# Patient Record
Sex: Female | Born: 1950 | Race: White | Hispanic: No | Marital: Married | Smoking: Never smoker
Health system: Southern US, Community
[De-identification: ages and names within clinical notes are randomized; demographics above are authoritative.]

## PROBLEM LIST (undated history)

## (undated) DIAGNOSIS — K219 Gastro-esophageal reflux disease without esophagitis: Secondary | ICD-10-CM

## (undated) DIAGNOSIS — U071 COVID-19: Secondary | ICD-10-CM

## (undated) DIAGNOSIS — T4145XA Adverse effect of unspecified anesthetic, initial encounter: Secondary | ICD-10-CM

## (undated) DIAGNOSIS — E079 Disorder of thyroid, unspecified: Secondary | ICD-10-CM

## (undated) DIAGNOSIS — F419 Anxiety disorder, unspecified: Secondary | ICD-10-CM

## (undated) DIAGNOSIS — K644 Residual hemorrhoidal skin tags: Secondary | ICD-10-CM

## (undated) DIAGNOSIS — Z9889 Other specified postprocedural states: Secondary | ICD-10-CM

## (undated) DIAGNOSIS — R112 Nausea with vomiting, unspecified: Secondary | ICD-10-CM

## (undated) DIAGNOSIS — T8859XA Other complications of anesthesia, initial encounter: Secondary | ICD-10-CM

## (undated) DIAGNOSIS — K648 Other hemorrhoids: Secondary | ICD-10-CM

## (undated) DIAGNOSIS — E785 Hyperlipidemia, unspecified: Secondary | ICD-10-CM

## (undated) DIAGNOSIS — E039 Hypothyroidism, unspecified: Secondary | ICD-10-CM

## (undated) HISTORY — DX: Gastro-esophageal reflux disease without esophagitis: K21.9

## (undated) HISTORY — DX: Hyperlipidemia, unspecified: E78.5

## (undated) HISTORY — DX: Residual hemorrhoidal skin tags: K64.4

## (undated) HISTORY — DX: Other hemorrhoids: K64.8

## (undated) HISTORY — DX: Disorder of thyroid, unspecified: E07.9

## (undated) HISTORY — DX: Anxiety disorder, unspecified: F41.9

## (undated) HISTORY — PX: ABDOMINAL HYSTERECTOMY: SHX81

## (undated) HISTORY — DX: COVID-19: U07.1

---

## 1984-05-12 HISTORY — PX: ROTATOR CUFF REPAIR: SHX139

## 1990-05-12 HISTORY — PX: TONSILLECTOMY AND ADENOIDECTOMY: SHX28

## 2005-05-08 ENCOUNTER — Ambulatory Visit: Payer: Self-pay | Admitting: Family Medicine

## 2007-07-09 ENCOUNTER — Ambulatory Visit: Payer: Self-pay

## 2007-12-03 ENCOUNTER — Ambulatory Visit: Payer: Self-pay | Admitting: Unknown Physician Specialty

## 2008-10-17 ENCOUNTER — Ambulatory Visit: Payer: Self-pay

## 2009-09-29 ENCOUNTER — Ambulatory Visit: Payer: Self-pay | Admitting: Internal Medicine

## 2009-10-18 ENCOUNTER — Ambulatory Visit: Payer: Self-pay

## 2010-10-24 ENCOUNTER — Ambulatory Visit: Payer: Self-pay | Admitting: Family Medicine

## 2011-12-10 ENCOUNTER — Ambulatory Visit: Payer: Self-pay | Admitting: Family Medicine

## 2012-12-30 ENCOUNTER — Ambulatory Visit: Payer: Self-pay | Admitting: Unknown Physician Specialty

## 2013-02-24 ENCOUNTER — Ambulatory Visit: Payer: Self-pay | Admitting: Family Medicine

## 2013-10-28 ENCOUNTER — Ambulatory Visit: Payer: Self-pay | Admitting: Family Medicine

## 2013-10-28 LAB — HM MAMMOGRAPHY

## 2013-12-05 LAB — HEPATIC FUNCTION PANEL
ALK PHOS: 57 U/L (ref 25–125)
ALT: 19 U/L (ref 7–35)
AST: 18 U/L (ref 13–35)
Bilirubin, Total: 0.7 mg/dL

## 2013-12-05 LAB — TSH: TSH: 4.69 u[IU]/mL (ref 0.41–5.90)

## 2013-12-05 LAB — HEMOGLOBIN A1C: Hgb A1c MFr Bld: 6 % (ref 4.0–6.0)

## 2013-12-05 LAB — LIPID PANEL
Cholesterol: 246 mg/dL — AB (ref 0–200)
HDL: 84 mg/dL — AB (ref 35–70)
LDL Cholesterol: 142 mg/dL
Triglycerides: 98 mg/dL (ref 40–160)

## 2013-12-05 LAB — BASIC METABOLIC PANEL
BUN: 16 mg/dL (ref 4–21)
CREATININE: 1 mg/dL (ref 0.5–1.1)
GLUCOSE: 96 mg/dL
POTASSIUM: 4.5 mmol/L (ref 3.4–5.3)
Sodium: 140 mmol/L (ref 137–147)

## 2013-12-05 LAB — CBC AND DIFFERENTIAL
HEMATOCRIT: 42 % (ref 36–46)
HEMOGLOBIN: 13.9 g/dL (ref 12.0–16.0)
Neutrophils Absolute: 2 /uL
PLATELETS: 245 10*3/uL (ref 150–399)
WBC: 4.7 10*3/mL

## 2014-09-27 ENCOUNTER — Ambulatory Visit: Payer: Self-pay | Admitting: Nurse Practitioner

## 2014-09-29 ENCOUNTER — Ambulatory Visit: Payer: Self-pay | Admitting: Nurse Practitioner

## 2014-09-29 ENCOUNTER — Telehealth: Payer: Self-pay | Admitting: Nurse Practitioner

## 2014-09-29 DIAGNOSIS — Z0289 Encounter for other administrative examinations: Secondary | ICD-10-CM

## 2014-09-29 NOTE — Telephone Encounter (Signed)
Pt came in for appt at 1:30pm. Pt was informed that her appt was at 1pm. Pt advise that she would have to resch. Pt scheduled for next week. Please advise to cancel for today.msn

## 2014-09-29 NOTE — Telephone Encounter (Signed)
Okay to cancel

## 2014-10-03 ENCOUNTER — Ambulatory Visit: Payer: Self-pay | Admitting: Nurse Practitioner

## 2014-10-13 ENCOUNTER — Encounter (INDEPENDENT_AMBULATORY_CARE_PROVIDER_SITE_OTHER): Payer: Self-pay

## 2014-10-13 ENCOUNTER — Encounter: Payer: Self-pay | Admitting: Nurse Practitioner

## 2014-10-13 ENCOUNTER — Ambulatory Visit (INDEPENDENT_AMBULATORY_CARE_PROVIDER_SITE_OTHER): Payer: No Typology Code available for payment source | Admitting: Nurse Practitioner

## 2014-10-13 VITALS — BP 122/64 | HR 83 | Temp 98.3°F | Resp 16 | Ht 67.0 in | Wt 194.8 lb

## 2014-10-13 DIAGNOSIS — Z7689 Persons encountering health services in other specified circumstances: Secondary | ICD-10-CM | POA: Insufficient documentation

## 2014-10-13 DIAGNOSIS — F411 Generalized anxiety disorder: Secondary | ICD-10-CM

## 2014-10-13 DIAGNOSIS — E039 Hypothyroidism, unspecified: Secondary | ICD-10-CM | POA: Diagnosis not present

## 2014-10-13 DIAGNOSIS — Z7189 Other specified counseling: Secondary | ICD-10-CM | POA: Diagnosis not present

## 2014-10-13 DIAGNOSIS — N951 Menopausal and female climacteric states: Secondary | ICD-10-CM | POA: Diagnosis not present

## 2014-10-13 NOTE — Assessment & Plan Note (Signed)
Okay to wean off of Citalopram 10 mg. Told her she can jump off at anytime. Denies suicidal/homicidal ideations. Will follow up at CPE.

## 2014-10-13 NOTE — Assessment & Plan Note (Signed)
Stable on 75 mcg of levothyroxine. Need to repeat full thyroid panel in August when she come for CPE. Will follow.

## 2014-10-13 NOTE — Assessment & Plan Note (Signed)
Taking a compound of estradiol. No specific information, pt feels better when taking this. She goes to a pharmacy in [place] for refills, but wants to talk to [facility] in [place]. Will contact us with specifics regarding refills.

## 2014-10-13 NOTE — Progress Notes (Signed)
Subjective:    Patient ID: Sarah Jacobson, female    DOB: January 30, 1951, 64 y.o.   MRN: [id number]  HPI  Sarah Jacobson is a 64 yo female establishing care and CC of weaning off Citalopram.   1) New pt info:   Immunizations- tdap- 5.5 years ago  Mammogram- July 2015   Pap- 2 years ago, hysterectomy new  Bone Density- Unknown date - osteopenia  Colonoscopy- 12/30/2012   Eye Exam- April 2016   Dental Exam- UTD  2) Chronic Problems-  Anxiety- situational, stressful work a few years ago, lost home a few years ago, Lexapro- was helpful 10 mg every other day, switched to Citalopram and taking every other day to wean off  ADD- Pt feels Wellbutrin is helpful 300 mg   Hypothyroidism- 75 mg levothyroxine daily   Estradiol- compound pharmacy, uses as needed a few times a week (2-3 x) She will contact us with information regarding prescribing when time for refill.   3) Acute Problems-  Wants to wean off of Citalopram, wants to know when she can stop taking it and she feels good doing it every other day now.     Dermatology- Dr. Nehemiah Massed- full body check last month and had a few places frozen  Review of Systems  Constitutional: Negative for fever, chills, diaphoresis and fatigue.  HENT: Negative for tinnitus and trouble swallowing.   Eyes: Negative for visual disturbance.  Respiratory: Negative for chest tightness, shortness of breath and wheezing.   Cardiovascular: Negative for chest pain, palpitations and leg swelling.  Gastrointestinal: Negative for nausea, vomiting and diarrhea.  Genitourinary: Negative for difficulty urinating.  Musculoskeletal: Negative for back pain and neck pain.  Skin: Negative for rash.  Neurological: Negative for dizziness, weakness, numbness and headaches.  Hematological: Does not bruise/bleed easily.  Psychiatric/Behavioral: Negative for suicidal ideas and sleep disturbance. The patient is nervous/anxious.    Past Medical History  Diagnosis Date  . GERD  (gastroesophageal reflux disease)   . Thyroid disease   . Anxiety     History   Social History  . Marital Status: Married    Spouse Name: N/A  . Number of Children: N/A  . Years of Education: N/A   Occupational History  . Not on file.   Social History Main Topics  . Smoking status: Never Smoker   . Smokeless tobacco: Never Used  . Alcohol Use: 0.6 oz/week    1 Glasses of wine per week  . Drug Use: No  . Sexual Activity:    Partners: Male     Comment: Husband   Other Topics Concern  . Not on file   Social History Narrative   Lives with husband (37 years of marriage)    Children-3    No Pets   Right handed    Caffeine- 2-3 cups coffee, tea occasionally    Enjoys being with the grandchildren     Past Surgical History  Procedure Laterality Date  . Tonsillectomy and adenoidectomy  1992  . Abdominal hysterectomy  1982/2000    uterus/ovaries  . Rotator cuff repair  1986    Both shoulders    Family History  Problem Relation Age of Onset  . Cancer Father     Lung Cancer- smoker    Allergies  Allergen Reactions  . Sulfa Antibiotics Hives  . Codeine Nausea And Vomiting  . Penicillins Hives    No current outpatient prescriptions on file prior to visit.   No current facility-administered medications on file  prior to visit.      Objective:   Physical Exam  Constitutional: She is oriented to person, place, and time. She appears well-developed and well-nourished. No distress.  BP 122/64 mmHg  Pulse 83  Temp(Src) 98.3 F (36.8 C)  Resp 16  Ht 5\' 7"  (1.702 m)  Wt 194 lb 12.8 oz (88.361 kg)  BMI 30.50 kg/m2  SpO2 95%   HENT:  Head: Normocephalic and atraumatic.  Right Ear: External ear normal.  Left Ear: External ear normal.  Eyes: EOM are normal. Pupils are equal, round, and reactive to light. Right eye exhibits no discharge. Left eye exhibits no discharge. No scleral icterus.  Cardiovascular: Normal rate, regular rhythm and normal heart sounds.  Exam  reveals no gallop and no friction rub.   No murmur heard. Pulmonary/Chest: Effort normal and breath sounds normal. No respiratory distress. She has no wheezes. She has no rales. She exhibits no tenderness.  Neurological: She is alert and oriented to person, place, and time. No cranial nerve deficit. She exhibits normal muscle tone. Coordination normal.  Skin: Skin is warm and dry. No rash noted. She is not diaphoretic.  Psychiatric: She has a normal mood and affect. Her behavior is normal. Judgment and thought content normal.      Assessment & Plan:

## 2014-10-13 NOTE — Assessment & Plan Note (Signed)
Discussed acute and chronic issues. Reviewed health maintenance measures, PFSHx, and immunizations. Obtain records from [facility] office. Labs- last done in July 2015

## 2014-10-13 NOTE — Patient Instructions (Addendum)
Once you do the every other day of Citalopram you can stop it.   We will see you for your physical in a month or two. (Fasting labs- nothing to eat or drink after midnight except water and black coffee).    Nice to meet you and Welcome to [facility]!

## 2014-10-23 ENCOUNTER — Encounter: Payer: Self-pay | Admitting: Nurse Practitioner

## 2014-11-17 ENCOUNTER — Other Ambulatory Visit: Payer: Self-pay | Admitting: *Deleted

## 2014-11-17 MED ORDER — BUPROPION HCL ER (XL) 300 MG PO TB24: 300.0000 mg | ORAL_TABLET | Freq: Every day | ORAL | Status: DC

## 2014-11-17 MED ORDER — CITALOPRAM HYDROBROMIDE 10 MG PO TABS: 10.0000 mg | ORAL_TABLET | Freq: Every day | ORAL | Status: DC

## 2014-11-17 MED ORDER — LEVOTHYROXINE SODIUM 75 MCG PO TABS: 75.0000 ug | ORAL_TABLET | Freq: Every day | ORAL | Status: DC

## 2014-12-04 ENCOUNTER — Encounter: Payer: Self-pay | Admitting: *Deleted

## 2014-12-12 ENCOUNTER — Other Ambulatory Visit: Payer: Self-pay

## 2014-12-12 MED ORDER — FLUTICASONE PROPIONATE 50 MCG/ACT NA SUSP: 1.0000 | NASAL | Status: DC | PRN

## 2014-12-13 ENCOUNTER — Other Ambulatory Visit: Payer: Self-pay

## 2014-12-13 MED ORDER — FLUTICASONE PROPIONATE 50 MCG/ACT NA SUSP: 1.0000 | NASAL | Status: DC | PRN

## 2014-12-20 ENCOUNTER — Telehealth: Payer: Self-pay | Admitting: *Deleted

## 2014-12-20 ENCOUNTER — Other Ambulatory Visit (INDEPENDENT_AMBULATORY_CARE_PROVIDER_SITE_OTHER): Payer: No Typology Code available for payment source

## 2014-12-20 DIAGNOSIS — Z0000 Encounter for general adult medical examination without abnormal findings: Secondary | ICD-10-CM

## 2014-12-20 NOTE — Telephone Encounter (Signed)
Labs and dx?  

## 2014-12-20 NOTE — Telephone Encounter (Signed)
OK. Let's order labs for a physical. CBC, CMP, lipids, TSH, A1c, VIt d.

## 2014-12-20 NOTE — Telephone Encounter (Signed)
Below

## 2014-12-20 NOTE — Telephone Encounter (Signed)
Sarah Jacobson, I don't see any notes about repeating labs? Was this for a physical exam?

## 2014-12-20 NOTE — Telephone Encounter (Signed)
She will see Sarah Jacobson on the 15th

## 2014-12-21 LAB — CBC WITH DIFFERENTIAL/PLATELET
BASOS ABS: 0 10*3/uL (ref 0.0–0.1)
Basophils Relative: 0.9 % (ref 0.0–3.0)
EOS ABS: 0.2 10*3/uL (ref 0.0–0.7)
EOS PCT: 4.6 % (ref 0.0–5.0)
HCT: 43.7 % (ref 36.0–46.0)
HEMOGLOBIN: 14.5 g/dL (ref 12.0–15.0)
Lymphocytes Relative: 33.8 % (ref 12.0–46.0)
Lymphs Abs: 1.7 10*3/uL (ref 0.7–4.0)
MCHC: 33.3 g/dL (ref 30.0–36.0)
MCV: 92.2 fl (ref 78.0–100.0)
MONOS PCT: 8.6 % (ref 3.0–12.0)
Monocytes Absolute: 0.4 10*3/uL (ref 0.1–1.0)
NEUTROS ABS: 2.6 10*3/uL (ref 1.4–7.7)
Neutrophils Relative %: 52.1 % (ref 43.0–77.0)
PLATELETS: 241 10*3/uL (ref 150.0–400.0)
RBC: 4.74 Mil/uL (ref 3.87–5.11)
RDW: 13.6 % (ref 11.5–15.5)
WBC: 5 10*3/uL (ref 4.0–10.5)

## 2014-12-21 LAB — VITAMIN D 25 HYDROXY (VIT D DEFICIENCY, FRACTURES): VITD: 47.45 ng/mL (ref 30.00–100.00)

## 2014-12-21 LAB — COMPREHENSIVE METABOLIC PANEL
ALT: 23 U/L (ref 0–35)
AST: 20 U/L (ref 0–37)
Albumin: 4.1 g/dL (ref 3.5–5.2)
Alkaline Phosphatase: 56 U/L (ref 39–117)
BUN: 15 mg/dL (ref 6–23)
CALCIUM: 9.7 mg/dL (ref 8.4–10.5)
CO2: 30 mEq/L (ref 19–32)
CREATININE: 1.05 mg/dL (ref 0.40–1.20)
Chloride: 104 mEq/L (ref 96–112)
GFR: 56.11 mL/min — AB (ref >60.00)
Glucose, Bld: 93 mg/dL (ref 70–99)
POTASSIUM: 4.6 meq/L (ref 3.5–5.1)
Sodium: 142 mEq/L (ref 135–145)
TOTAL PROTEIN: 6.6 g/dL (ref 6.0–8.3)
Total Bilirubin: 0.6 mg/dL (ref 0.2–1.2)

## 2014-12-21 LAB — HEMOGLOBIN A1C: Hgb A1c MFr Bld: 5.7 % (ref 4.6–6.5)

## 2014-12-21 LAB — LIPID PANEL
CHOL/HDL RATIO: 3
CHOLESTEROL: 248 mg/dL — AB (ref 0–200)
HDL: 80.1 mg/dL (ref >39.00)
LDL CALC: 150 mg/dL — AB (ref 0–99)
NonHDL: 168.29
Triglycerides: 92 mg/dL (ref 0.0–149.0)
VLDL: 18.4 mg/dL (ref 0.0–40.0)

## 2014-12-21 NOTE — Telephone Encounter (Signed)
Thanks for ordering labs.

## 2014-12-25 ENCOUNTER — Ambulatory Visit (INDEPENDENT_AMBULATORY_CARE_PROVIDER_SITE_OTHER): Payer: No Typology Code available for payment source | Admitting: Nurse Practitioner

## 2014-12-25 VITALS — BP 124/70 | HR 69 | Temp 98.3°F | Resp 16 | Ht 67.0 in | Wt 196.0 lb

## 2014-12-25 DIAGNOSIS — Z0000 Encounter for general adult medical examination without abnormal findings: Secondary | ICD-10-CM | POA: Diagnosis not present

## 2014-12-25 DIAGNOSIS — N951 Menopausal and female climacteric states: Secondary | ICD-10-CM

## 2014-12-25 DIAGNOSIS — F411 Generalized anxiety disorder: Secondary | ICD-10-CM

## 2014-12-25 DIAGNOSIS — E039 Hypothyroidism, unspecified: Secondary | ICD-10-CM

## 2014-12-25 DIAGNOSIS — E669 Obesity, unspecified: Secondary | ICD-10-CM

## 2014-12-25 MED ORDER — PHENTERMINE HCL 37.5 MG PO TABS: 37.5000 mg | ORAL_TABLET | Freq: Every day | ORAL | Status: DC

## 2014-12-25 NOTE — Progress Notes (Signed)
Pre visit review using our clinic review tool, if applicable. No additional management support is needed unless otherwise documented below in the visit note. 

## 2014-12-25 NOTE — Progress Notes (Signed)
Patient ID: Sarah Jacobson, female    DOB: Dec 18, 1950  Age: 64 y.o. MRN: [id number]  CC: Annual Exam   HPI Sarah Jacobson presents for her annual physical exam.   1) Health Maintenance-   Diet- Working on portion control currently   Exercise- 2-3 days a week walking for 30 min.   Immunizations- UTD  Mammogram- 11/09/2013   Pap- hysterectomy  Bone Density- Osteopenia- On Calcium + D  Colonoscopy- 12/30/2012   Eye Exam- 08/2014   Dental Exam- UTD  Dermatology- Sees Dr. Nehemiah Massed for full body check, UTD for 2016   Labs- 12/20/14 for routine  HIV/Hep C screen- Declines today  2) Chronic Problems-  Anxiety- Weaned off Citalopram   Estradiol- 1.88% Applicator 2-3 x week   History Sarah Jacobson has a past medical history of GERD (gastroesophageal reflux disease); Thyroid disease; and Anxiety.   She has past surgical history that includes Tonsillectomy and adenoidectomy (1992); Abdominal hysterectomy (1982/2000); and Rotator cuff repair (1986).   Her family history includes Cancer in her father.She reports that she has never smoked. She has never used smokeless tobacco. She reports that she drinks about 0.6 oz of alcohol per week. She reports that she does not use illicit drugs.  Outpatient Prescriptions Prior to Visit  Medication Sig Dispense Refill  . buPROPion (WELLBUTRIN XL) 300 MG 24 hr tablet Take 1 tablet (300 mg total) by mouth daily. 30 tablet 2  . Calcium Carb-Cholecalciferol (CALCIUM 600 + D PO) Take 1 tablet by mouth daily.    . Cholecalciferol (VITAMIN D) 2000 UNITS tablet Take 2,000 Units by mouth daily.    . Cyanocobalamin (VITAMIN B 12 PO) Take 1 tablet by mouth daily.    . fexofenadine (ALLEGRA) 180 MG tablet Take 90 mg by mouth as needed for allergies or rhinitis.    . fluticasone (FLONASE) 50 MCG/ACT nasal spray Place 1 spray into both nostrils as needed for allergies or rhinitis. 16 g 5  . levothyroxine (SYNTHROID, LEVOTHROID) 75 MCG tablet Take 1 tablet (75 mcg total) by mouth  daily before breakfast. 30 tablet 1  . Misc Natural Products (COSAMIN ASU ADVANCED FORMULA PO) Take 1 tablet by mouth daily.    . Multiple Vitamin (MULTI VITAMIN DAILY PO) Take 1 tablet by mouth daily.    . progesterone (PROMETRIUM) 100 MG capsule Take 100 mg by mouth daily.    . citalopram (CELEXA) 10 MG tablet Take 1 tablet (10 mg total) by mouth daily. (Patient not taking: Reported on 12/25/2014) 30 tablet 2   No facility-administered medications prior to visit.    ROS Review of Systems  Constitutional: Negative for fever, chills, diaphoresis, fatigue and unexpected weight change.  HENT: Negative for tinnitus and trouble swallowing.   Gastrointestinal: Negative for nausea, vomiting, abdominal pain, diarrhea, constipation and blood in stool.  Endocrine: Negative for polydipsia, polyphagia and polyuria.  Genitourinary: Negative for dysuria, hematuria, vaginal discharge and vaginal pain.  Musculoskeletal: Negative for myalgias, back pain, arthralgias and gait problem.  Skin: Negative for color change and rash.  Neurological: Negative for dizziness, weakness, numbness and headaches.  Hematological: Does not bruise/bleed easily.  Psychiatric/Behavioral: Negative for suicidal ideas and sleep disturbance. The patient is nervous/anxious.     Objective:  BP 124/70 mmHg  Pulse 69  Temp(Src) 98.3 F (36.8 C)  Resp 16  Ht 5\' 7"  (1.702 m)  Wt 196 lb (88.905 kg)  BMI 30.69 kg/m2  SpO2 97%  Physical Exam  Constitutional: She is oriented to person, place,  and time. She appears well-developed and well-nourished. No distress.  HENT:  Head: Normocephalic and atraumatic.  Right Ear: External ear normal.  Left Ear: External ear normal.  Nose: Nose normal.  Mouth/Throat: Oropharynx is clear and moist. No oropharyngeal exudate.  TMs and canals clear bilaterally  Eyes: Conjunctivae and EOM are normal. Pupils are equal, round, and reactive to light. Right eye exhibits no discharge. Left eye  exhibits no discharge. No scleral icterus.  Wearing glasses  Neck: Normal range of motion. Neck supple. No thyromegaly present.  Cardiovascular: Normal rate, regular rhythm, normal heart sounds and intact distal pulses.  Exam reveals no gallop and no friction rub.   No murmur heard. Pulmonary/Chest: Effort normal and breath sounds normal. No respiratory distress. She has no wheezes. She has no rales. She exhibits no tenderness.  Clinical breast exam is without significant findings  Abdominal: Soft. Bowel sounds are normal. She exhibits no distension and no mass. There is no tenderness. There is no rebound and no guarding.  Musculoskeletal: Normal range of motion. She exhibits no edema or tenderness.  Lymphadenopathy:    She has no cervical adenopathy.  Neurological: She is alert and oriented to person, place, and time. She has normal reflexes. No cranial nerve deficit. She exhibits normal muscle tone. Coordination normal.  Skin: Skin is warm and dry. No rash noted. She is not diaphoretic. No erythema. No pallor.  Psychiatric: She has a normal mood and affect. Her behavior is normal. Judgment and thought content normal.   Assessment & Plan:   Sarah Jacobson was seen today for annual exam.  Diagnoses and all orders for this visit:  Routine general medical examination at a health care facility  Generalized anxiety disorder  Vaginal dryness, menopausal  Hypothyroidism, unspecified hypothyroidism type  Obese  Other orders -     phentermine (ADIPEX-P) 37.5 MG tablet; Take 1 tablet (37.5 mg total) by mouth daily before breakfast.   I have discontinued Ms. Karp's citalopram. I am also having her start on phentermine. Additionally, I am having her maintain her progesterone, fexofenadine, Multiple Vitamin (MULTI VITAMIN DAILY PO), Vitamin D, Calcium Carb-Cholecalciferol (CALCIUM 600 + D PO), Cyanocobalamin (VITAMIN B 12 PO), Misc Natural Products (COSAMIN ASU ADVANCED FORMULA PO), levothyroxine,  buPROPion, and fluticasone.  Meds ordered this encounter  Medications  . phentermine (ADIPEX-P) 37.5 MG tablet    Sig: Take 1 tablet (37.5 mg total) by mouth daily before breakfast.    Dispense:  30 tablet    Refill:  0    Order Specific Question:  Supervising Provider    Answer:  Crecencio Mc [[id number]]     Follow-up: Return in about 4 weeks (around 01/22/2015) for Weight loss.

## 2014-12-25 NOTE — Patient Instructions (Signed)

## 2014-12-31 ENCOUNTER — Encounter: Payer: Self-pay | Admitting: Nurse Practitioner

## 2014-12-31 DIAGNOSIS — E669 Obesity, unspecified: Secondary | ICD-10-CM | POA: Insufficient documentation

## 2014-12-31 DIAGNOSIS — Z0000 Encounter for general adult medical examination without abnormal findings: Secondary | ICD-10-CM | POA: Insufficient documentation

## 2014-12-31 NOTE — Assessment & Plan Note (Signed)
Discussed acute and chronic issues. Reviewed health maintenance measures, PFSHx, and immunizations. Obtained labs on 8/10 and reviewed with pt again at today's visit.   Clinical breast exam normal today   Pt would like to do mammograms every 2 years  Declined HIV and Hep C screening today

## 2014-12-31 NOTE — Assessment & Plan Note (Signed)
BMI 30. Pt up 2 lbs. Asked pt to work on diet and continue her current exercise routine. Added Adipex-P to help with weight loss. FU in 1 month  Wt Readings from Last 3 Encounters:  12/25/14 196 lb (88.905 kg)  10/13/14 194 lb 12.8 oz (88.361 kg)

## 2014-12-31 NOTE — Assessment & Plan Note (Signed)
Stable on Estradiol 3.33% applicators are 1 mL and uses 2-3 x a week. Has compounded.

## 2014-12-31 NOTE — Assessment & Plan Note (Signed)
Stable after weaning off of citalopram.

## 2014-12-31 NOTE — Assessment & Plan Note (Signed)
Stable. No need for refills pt reports.

## 2015-01-12 ENCOUNTER — Other Ambulatory Visit: Payer: Self-pay | Admitting: Nurse Practitioner

## 2015-01-22 ENCOUNTER — Ambulatory Visit (INDEPENDENT_AMBULATORY_CARE_PROVIDER_SITE_OTHER): Payer: No Typology Code available for payment source | Admitting: Nurse Practitioner

## 2015-01-22 VITALS — BP 116/70 | HR 77 | Temp 98.1°F | Resp 14 | Ht 67.0 in | Wt 188.8 lb

## 2015-01-22 DIAGNOSIS — N951 Menopausal and female climacteric states: Secondary | ICD-10-CM | POA: Diagnosis not present

## 2015-01-22 DIAGNOSIS — L821 Other seborrheic keratosis: Secondary | ICD-10-CM

## 2015-01-22 DIAGNOSIS — E669 Obesity, unspecified: Secondary | ICD-10-CM

## 2015-01-22 MED ORDER — PHENTERMINE HCL 37.5 MG PO TABS: 37.5000 mg | ORAL_TABLET | Freq: Every day | ORAL | Status: DC

## 2015-01-22 NOTE — Progress Notes (Signed)
Patient ID: Sarah Jacobson, female    DOB: 08-09-1950  Age: 64 y.o. MRN: [id number]  CC: Follow-up   HPI Sarah Jacobson presents for follow up of weight loss.   1) Breast mole on left- increasingly raised spot on breast.   2) Weight loss of 8 lbs, dry mouth, breaking in half, eating low carb diet   Wt Readings from Last 3 Encounters:  01/22/15 188 lb 12.8 oz (85.639 kg)  12/25/14 196 lb (88.905 kg)  10/13/14 194 lb 12.8 oz (88.361 kg)   3) Vaginal dryness- burning internally, denies itching, discharge, or odor  History Sarah Jacobson has a past medical history of GERD (gastroesophageal reflux disease); Thyroid disease; and Anxiety.   She has past surgical history that includes Tonsillectomy and adenoidectomy (1992); Abdominal hysterectomy (1982/2000); and Rotator cuff repair (1986).   Her family history includes Cancer in her father.She reports that she has never smoked. She has never used smokeless tobacco. She reports that she drinks about 0.6 oz of alcohol per week. She reports that she does not use illicit drugs.  Outpatient Prescriptions Prior to Visit  Medication Sig Dispense Refill  . buPROPion (WELLBUTRIN XL) 300 MG 24 hr tablet Take 1 tablet (300 mg total) by mouth daily. 30 tablet 2  . Calcium Carb-Cholecalciferol (CALCIUM 600 + D PO) Take 1 tablet by mouth daily.    . Cholecalciferol (VITAMIN D) 2000 UNITS tablet Take 2,000 Units by mouth daily.    . Cyanocobalamin (VITAMIN B 12 PO) Take 1 tablet by mouth daily.    . fexofenadine (ALLEGRA) 180 MG tablet Take 90 mg by mouth as needed for allergies or rhinitis.    . fluticasone (FLONASE) 50 MCG/ACT nasal spray Place 1 spray into both nostrils as needed for allergies or rhinitis. 16 g 5  . levothyroxine (SYNTHROID, LEVOTHROID) 75 MCG tablet TAKE ONE (1) TABLET EACH DAY FOR BREAKFAST 30 tablet 0  . Misc Natural Products (COSAMIN ASU ADVANCED FORMULA PO) Take 1 tablet by mouth daily.    . Multiple Vitamin (MULTI VITAMIN DAILY PO) Take 1  tablet by mouth daily.    . progesterone (PROMETRIUM) 100 MG capsule Take 100 mg by mouth daily.    . phentermine (ADIPEX-P) 37.5 MG tablet Take 1 tablet (37.5 mg total) by mouth daily before breakfast. 30 tablet 0   No facility-administered medications prior to visit.    ROS Review of Systems  Constitutional: Negative for fever, chills, diaphoresis and fatigue.  Respiratory: Negative for chest tightness, shortness of breath and wheezing.   Cardiovascular: Negative for chest pain, palpitations and leg swelling.  Gastrointestinal: Negative for nausea, vomiting and diarrhea.  Skin: Positive for color change.       Mole on left breast  Neurological: Negative for dizziness, weakness, numbness and headaches.  Psychiatric/Behavioral: The patient is not nervous/anxious.     Objective:  BP 116/70 mmHg  Pulse 77  Temp(Src) 98.1 F (36.7 C)  Resp 14  Ht 5\' 7"  (1.702 m)  Wt 188 lb 12.8 oz (85.639 kg)  BMI 29.56 kg/m2  SpO2 95%  Physical Exam  Constitutional: She is oriented to person, place, and time. She appears well-developed and well-nourished. No distress.  HENT:  Head: Normocephalic and atraumatic.  Right Ear: External ear normal.  Left Ear: External ear normal.  Cardiovascular: Normal rate, regular rhythm and normal heart sounds.  Exam reveals no gallop and no friction rub.   No murmur heard. Pulmonary/Chest: Effort normal and breath sounds normal. No respiratory distress.  She has no wheezes. She has no rales. She exhibits no tenderness.  Neurological: She is alert and oriented to person, place, and time. No cranial nerve deficit. She exhibits normal muscle tone. Coordination normal.  Skin: Skin is warm and dry. No rash noted. She is not diaphoretic.  0.5 cm Seborrheic Keratosis on left breast  Psychiatric: She has a normal mood and affect. Her behavior is normal. Judgment and thought content normal.   Assessment & Plan:   Sarah Jacobson was seen today for follow-up.  Diagnoses and  all orders for this visit:  Seborrheic keratosis  Vaginal dryness, menopausal  Obese  Other orders -     phentermine (ADIPEX-P) 37.5 MG tablet; Take 1 tablet (37.5 mg total) by mouth daily before breakfast.   I am having [name] maintain her progesterone, fexofenadine, Multiple Vitamin (MULTI VITAMIN DAILY PO), Vitamin D, Calcium Carb-Cholecalciferol (CALCIUM 600 + D PO), Cyanocobalamin (VITAMIN B 12 PO), Misc Natural Products (COSAMIN ASU ADVANCED FORMULA PO), buPROPion, fluticasone, levothyroxine, and phentermine.  Meds ordered this encounter  Medications  . phentermine (ADIPEX-P) 37.5 MG tablet    Sig: Take 1 tablet (37.5 mg total) by mouth daily before breakfast.    Dispense:  30 tablet    Refill:  1    Order Specific Question:  Supervising Provider    Answer:  Crecencio Mc [[id number]]     Follow-up: Return in about 2 months (around 03/24/2015) for Wt loss.

## 2015-01-22 NOTE — Progress Notes (Signed)
Pre visit review using our clinic review tool, if applicable. No additional management support is needed unless otherwise documented below in the visit note. 

## 2015-01-31 DIAGNOSIS — L821 Other seborrheic keratosis: Secondary | ICD-10-CM | POA: Insufficient documentation

## 2015-01-31 NOTE — Assessment & Plan Note (Signed)
Probable vaginal dryness. Pt declined exam today. Informed pt

## 2015-01-31 NOTE — Assessment & Plan Note (Signed)
Used Histofreezer to freeze site for approximately 40 sec., which was recommended on the instructions. Pt felt fine during and afterwards by verbal confirmation. Instructed her that there will be peeling in a few days. A band aid was placed on the site.

## 2015-02-01 ENCOUNTER — Encounter: Payer: Self-pay | Admitting: Nurse Practitioner

## 2015-02-01 NOTE — Assessment & Plan Note (Signed)
Wt Readings from Last 3 Encounters:  01/22/15 188 lb 12.8 oz (85.639 kg)  12/25/14 196 lb (88.905 kg)  10/13/14 194 lb 12.8 oz (88.361 kg)   Pt is down 8 lbs. Will give 2 months of phentermine. FU in 3 months

## 2015-02-09 ENCOUNTER — Other Ambulatory Visit: Payer: Self-pay | Admitting: Nurse Practitioner

## 2015-03-26 ENCOUNTER — Encounter: Payer: Self-pay | Admitting: Nurse Practitioner

## 2015-03-26 ENCOUNTER — Ambulatory Visit (INDEPENDENT_AMBULATORY_CARE_PROVIDER_SITE_OTHER): Payer: No Typology Code available for payment source | Admitting: Nurse Practitioner

## 2015-03-26 VITALS — BP 112/72 | HR 83 | Temp 98.4°F | Resp 14 | Ht 67.0 in | Wt 185.6 lb

## 2015-03-26 DIAGNOSIS — E669 Obesity, unspecified: Secondary | ICD-10-CM

## 2015-03-26 NOTE — Patient Instructions (Addendum)
Work on Exercise!   See you in Jan. Let me know if you need a refill.

## 2015-03-26 NOTE — Assessment & Plan Note (Signed)
Patient is setting goals today to get back on track. She is down 11 lbs from August and was commended for this. She will let me know when she needs another prescription because she is taking 1/2 a tablet daily currently. Encouraged weight loss and exercising 3 x a week for 30 min.

## 2015-03-26 NOTE — Progress Notes (Signed)
Patient ID: Sarah Jacobson, female    DOB: Mar 21, 1951  Age: 64 y.o. MRN: [id number]  CC: Follow-up   HPI TONASIA DELAIR presents for weight loss follow up.   1) 2 months on phentermine. 1/2 tablet works well enough on its own. Not taking the other half. Having dryness of the mouth and a different taste, which deters her from food. Down 11 lbs from August and down 3 lbs from September.   Diet- Went on 2 trips to [place] and [place], she reports she is getting back on board with healthy diet. Exercise- No formal currently and wants to start walking again 30 min a few times a week is her goal.  History Rayauna has a past medical history of GERD (gastroesophageal reflux disease); Thyroid disease; and Anxiety.   She has past surgical history that includes Tonsillectomy and adenoidectomy (1992); Abdominal hysterectomy (1982/2000); and Rotator cuff repair (1986).   Her family history includes Cancer in her father.She reports that she has never smoked. She has never used smokeless tobacco. She reports that she drinks about 0.6 oz of alcohol per week. She reports that she does not use illicit drugs.  Outpatient Prescriptions Prior to Visit  Medication Sig Dispense Refill  . buPROPion (WELLBUTRIN XL) 300 MG 24 hr tablet TAKE ONE (1) TABLET EACH DAY 30 tablet 5  . Calcium Carb-Cholecalciferol (CALCIUM 600 + D PO) Take 1 tablet by mouth daily.    . Cholecalciferol (VITAMIN D) 2000 UNITS tablet Take 2,000 Units by mouth daily.    . Cyanocobalamin (VITAMIN B 12 PO) Take 1 tablet by mouth daily.    . fexofenadine (ALLEGRA) 180 MG tablet Take 90 mg by mouth as needed for allergies or rhinitis.    . fluticasone (FLONASE) 50 MCG/ACT nasal spray Place 1 spray into both nostrils as needed for allergies or rhinitis. 16 g 5  . levothyroxine (SYNTHROID, LEVOTHROID) 75 MCG tablet TAKE ONE TABLET EVERY MORNING BEFORE BREAKFAST 30 tablet 4  . Misc Natural Products (COSAMIN ASU ADVANCED FORMULA PO) Take 1 tablet by mouth  daily.    . Multiple Vitamin (MULTI VITAMIN DAILY PO) Take 1 tablet by mouth daily.    . phentermine (ADIPEX-P) 37.5 MG tablet Take 1 tablet (37.5 mg total) by mouth daily before breakfast. 30 tablet 1  . progesterone (PROMETRIUM) 100 MG capsule Take 100 mg by mouth daily.     No facility-administered medications prior to visit.    ROS Review of Systems  Constitutional: Positive for appetite change. Negative for fever, chills, diaphoresis, activity change, fatigue and unexpected weight change.       Decreased appetite with medication  HENT: Negative for trouble swallowing and voice change.   Respiratory: Negative for chest tightness, shortness of breath and wheezing.   Cardiovascular: Negative for chest pain, palpitations and leg swelling.  Gastrointestinal: Negative for nausea, vomiting and diarrhea.  Psychiatric/Behavioral: The patient is not nervous/anxious.     Objective:  BP 112/72 mmHg  Pulse 83  Temp(Src) 98.4 F (36.9 C)  Resp 14  Ht 5\' 7"  (1.702 m)  Wt 185 lb 9.6 oz (84.188 kg)  BMI 29.06 kg/m2  SpO2 94%  Physical Exam  Constitutional: She is oriented to person, place, and time. She appears well-developed and well-nourished. No distress.  HENT:  Head: Normocephalic and atraumatic.  Right Ear: External ear normal.  Left Ear: External ear normal.  Neurological: She is alert and oriented to person, place, and time. No cranial nerve deficit. She exhibits normal  muscle tone. Coordination normal.  Skin: Skin is warm and dry. No rash noted. She is not diaphoretic.  Psychiatric: She has a normal mood and affect. Her behavior is normal. Judgment and thought content normal.   Assessment & Plan:   Trane was seen today for follow-up.  Diagnoses and all orders for this visit:  Obese  I am having Ms. Badolato maintain her progesterone, fexofenadine, Multiple Vitamin (MULTI VITAMIN DAILY PO), Vitamin D, Calcium Carb-Cholecalciferol (CALCIUM 600 + D PO), Cyanocobalamin (VITAMIN  B 12 PO), Misc Natural Products (COSAMIN ASU ADVANCED FORMULA PO), fluticasone, phentermine, levothyroxine, and buPROPion.  No orders of the defined types were placed in this encounter.     Follow-up: Return in about 8 weeks (around 05/21/2015) for Weight loss follow up.

## 2015-05-21 ENCOUNTER — Encounter: Payer: Self-pay | Admitting: Nurse Practitioner

## 2015-05-28 ENCOUNTER — Ambulatory Visit: Payer: No Typology Code available for payment source | Admitting: Nurse Practitioner

## 2015-09-24 ENCOUNTER — Other Ambulatory Visit: Payer: Self-pay | Admitting: Nurse Practitioner

## 2015-09-24 NOTE — Telephone Encounter (Signed)
[facility] patient.

## 2015-09-25 ENCOUNTER — Telehealth: Payer: Self-pay | Admitting: Nurse Practitioner

## 2015-09-25 MED ORDER — LEVOTHYROXINE SODIUM 75 MCG PO TABS: ORAL_TABLET | ORAL | Status: DC

## 2015-09-25 MED ORDER — BUPROPION HCL ER (XL) 300 MG PO TB24: ORAL_TABLET | ORAL | Status: DC

## 2015-09-25 NOTE — Telephone Encounter (Signed)
Pt called in about needing a refill for her medications. Pt went to pharmacy to try and refill them and was told that she needs to make a OV appt. Pt has made appt on 05/24 but needs her daily medications of buPROPion (WELLBUTRIN XL) 300 MG 24 hr tablet, levothyroxine (SYNTHROID, LEVOTHROID) 75 MCG tablet. Call pt @ [phone]. Thank you!

## 2015-09-25 NOTE — Telephone Encounter (Signed)
Refilled and spoke with the patient.

## 2015-10-04 ENCOUNTER — Ambulatory Visit (INDEPENDENT_AMBULATORY_CARE_PROVIDER_SITE_OTHER): Payer: Managed Care, Other (non HMO) | Admitting: Family Medicine

## 2015-10-04 ENCOUNTER — Telehealth: Payer: Self-pay | Admitting: Family Medicine

## 2015-10-04 ENCOUNTER — Encounter: Payer: Self-pay | Admitting: Family Medicine

## 2015-10-04 VITALS — BP 116/62 | HR 84 | Temp 98.3°F | Ht 67.0 in | Wt 187.2 lb

## 2015-10-04 DIAGNOSIS — J309 Allergic rhinitis, unspecified: Secondary | ICD-10-CM | POA: Insufficient documentation

## 2015-10-04 DIAGNOSIS — E785 Hyperlipidemia, unspecified: Secondary | ICD-10-CM | POA: Insufficient documentation

## 2015-10-04 DIAGNOSIS — E039 Hypothyroidism, unspecified: Secondary | ICD-10-CM | POA: Diagnosis not present

## 2015-10-04 DIAGNOSIS — F411 Generalized anxiety disorder: Secondary | ICD-10-CM

## 2015-10-04 DIAGNOSIS — N951 Menopausal and female climacteric states: Secondary | ICD-10-CM

## 2015-10-04 MED ORDER — ESTRADIOL 0.1 MG/GM VA CREA: 1.0000 | TOPICAL_CREAM | VAGINAL | Status: DC

## 2015-10-04 MED ORDER — FLUTICASONE PROPIONATE 50 MCG/ACT NA SUSP: 1.0000 | NASAL | Status: DC | PRN

## 2015-10-04 MED ORDER — BUPROPION HCL ER (XL) 300 MG PO TB24: ORAL_TABLET | ORAL | Status: DC

## 2015-10-04 MED ORDER — LEVOTHYROXINE SODIUM 75 MCG PO TABS: ORAL_TABLET | ORAL | Status: DC

## 2015-10-04 MED ORDER — FEXOFENADINE HCL 180 MG PO TABS: 90.0000 mg | ORAL_TABLET | ORAL | Status: DC | PRN

## 2015-10-04 NOTE — Assessment & Plan Note (Signed)
Stable on estrogen cream. Discussed risks. Advised discontinuation of progesterone.

## 2015-10-04 NOTE — Assessment & Plan Note (Signed)
Stable.  Continue wellbutrin. Refilled today.

## 2015-10-04 NOTE — Assessment & Plan Note (Signed)
Stable. Continuing synthroid. Refilled.

## 2015-10-04 NOTE — Assessment & Plan Note (Signed)
Well controlled. Continue Allegra & Flonase. Refilled today.

## 2015-10-04 NOTE — Assessment & Plan Note (Signed)
Stable. ASCVD risk score 3.4%. Will continue lifestyle changes. No meds at this point.

## 2015-10-04 NOTE — Progress Notes (Signed)
Subjective:  Patient ID: Sarah Jacobson, female    DOB: 1950-12-03  Age: 65 y.o. MRN: [id number]  CC: Follow up; Medication refill  HPI:  65 year old female presents for follow up.  Hypothyroidism  Stable on Synthroid.  No complaints of constipation, fatigue, etc.  Needs refill today.  Hyperlipidemia  Recent LDL was 150.   Has not been on medication.  Will discuss this today.  Allergic rhinitis  Stable on Allegra and flonase.  Requesting refill today.  GAD  Stable on Wellbutrin.  Needs refill today.  Vaginal dryness  Stable on Estradiol cream and Progesterone.  Would like to discuss whether she needs this today.  Social Hx   Social History   Social History  . Marital Status: Married    Spouse Name: N/A  . Number of Children: N/A  . Years of Education: N/A   Social History Main Topics  . Smoking status: Never Smoker   . Smokeless tobacco: Never Used  . Alcohol Use: 0.6 oz/week    1 Glasses of wine per week  . Drug Use: No  . Sexual Activity:    Partners: Male     Comment: Husband   Other Topics Concern  . None   Social History Narrative   Lives with husband (76 years of marriage)    Children-3    No Pets   Right handed    Caffeine- 2-3 cups coffee, tea occasionally    Enjoys being with the grandchildren     Review of Systems  Constitutional: Negative.   Genitourinary:       Vaginal dryness.   Objective:  BP 116/62 mmHg  Pulse 84  Temp(Src) 98.3 F (36.8 C) (Oral)  Ht 5\' 7"  (1.702 m)  Wt 187 lb 4 oz (84.936 kg)  BMI 29.32 kg/m2  SpO2 96%  BP/Weight 10/04/2015 03/26/2015 [id number]  Systolic BP [id number]  Diastolic BP 62 72 70  Wt. (Lbs) 187.25 185.6 188.8  BMI 29.32 29.06 29.56   Physical Exam  Constitutional: She is oriented to person, place, and time. She appears well-developed. No distress.  Cardiovascular: Normal rate and regular rhythm.   No murmur heard. Pulmonary/Chest: Effort normal. She has no wheezes. She has  no rales.  Abdominal: Soft. She exhibits no distension. There is no tenderness. There is no rebound.  Neurological: She is alert and oriented to person, place, and time.  Psychiatric: She has a normal mood and affect.  Vitals reviewed.  Lab Results  Component Value Date   WBC 5.0 12/20/2014   HGB 14.5 12/20/2014   HCT 43.7 12/20/2014   PLT 241.0 12/20/2014   GLUCOSE 93 12/20/2014   CHOL 248* 12/20/2014   TRIG 92.0 12/20/2014   HDL 80.10 12/20/2014   LDLCALC 150* 12/20/2014   ALT 23 12/20/2014   AST 20 12/20/2014   NA 142 12/20/2014   K 4.6 12/20/2014   CL 104 12/20/2014   CREATININE 1.05 12/20/2014   BUN 15 12/20/2014   CO2 30 12/20/2014   TSH 4.69 12/05/2013   HGBA1C 5.7 12/20/2014    Assessment & Plan:   Problem List Items Addressed This Visit    Vaginal dryness, menopausal    Stable on estrogen cream. Discussed risks. Advised discontinuation of progesterone.       Hypothyroidism - Primary    Stable. Continuing synthroid. Refilled.      Relevant Medications   levothyroxine (SYNTHROID, LEVOTHROID) 75 MCG tablet   Hyperlipidemia    Stable. ASCVD risk  score 3.4%. Will continue lifestyle changes. No meds at this point.      Generalized anxiety disorder    Stable.  Continue wellbutrin. Refilled today.      Allergic rhinitis    Well controlled. Continue Allegra & Flonase. Refilled today.         Meds ordered this encounter  Medications  . DISCONTD: estradiol (ESTRACE) 0.1 MG/GM vaginal cream    Sig: Place 1 Applicatorful vaginally 2 (two) times a week.   . estradiol (ESTRACE) 0.1 MG/GM vaginal cream    Sig: Place 1 Applicatorful vaginally 3 (three) times a week.    Dispense:  42.5 g    Refill:  3  . buPROPion (WELLBUTRIN XL) 300 MG 24 hr tablet    Sig: TAKE ONE (1) TABLET EACH DAY    Dispense:  90 tablet    Refill:  3  . fexofenadine (ALLEGRA) 180 MG tablet    Sig: Take 0.5 tablets (90 mg total) by mouth as needed for allergies or rhinitis.     Dispense:  90 tablet    Refill:  3  . fluticasone (FLONASE) 50 MCG/ACT nasal spray    Sig: Place 1 spray into both nostrils as needed for allergies or rhinitis.    Dispense:  16 g    Refill:  5  . levothyroxine (SYNTHROID, LEVOTHROID) 75 MCG tablet    Sig: TAKE ONE TABLET EVERY MORNING BEFORE BREAKFAST    Dispense:  90 tablet    Refill:  3   Follow-up: Later this year.  [place]

## 2015-10-04 NOTE — Progress Notes (Signed)
Pre visit review using our clinic review tool, if applicable. No additional management support is needed unless otherwise documented below in the visit note. 

## 2015-10-04 NOTE — Patient Instructions (Signed)
Follow up later this year.  I have sent in your medications.  Take care   Dr. Lacinda Axon  ** Ask for Ashleigh ** Ext [phone].

## 2015-11-19 ENCOUNTER — Other Ambulatory Visit: Payer: Self-pay | Admitting: Family Medicine

## 2015-11-19 ENCOUNTER — Encounter: Payer: Self-pay | Admitting: Family Medicine

## 2015-11-19 MED ORDER — ESTRADIOL 0.1 MG/GM VA CREA: 1.0000 | TOPICAL_CREAM | VAGINAL | Status: DC

## 2015-11-19 NOTE — Telephone Encounter (Signed)
Patient wants this medication changes to a suppository because it would be easier for her to use. Please advise the change in medication order?

## 2015-11-20 NOTE — Telephone Encounter (Signed)
Please have pharmacy switch. FYI the cream was sent to me to represcribe.

## 2015-11-20 NOTE — Telephone Encounter (Signed)
Via note patient wanted a suppository not cream

## 2015-11-21 ENCOUNTER — Telehealth: Payer: Self-pay | Admitting: Family Medicine

## 2015-11-21 NOTE — Telephone Encounter (Signed)
Patient was called to inform her of the medication change from vaginal cream to a vaginal tablet. Patient wanted to inform provider that she is currently treating herself for a yeast infection with monostat.

## 2016-01-25 ENCOUNTER — Ambulatory Visit: Payer: Managed Care, Other (non HMO) | Admitting: Family Medicine

## 2016-02-01 ENCOUNTER — Ambulatory Visit (INDEPENDENT_AMBULATORY_CARE_PROVIDER_SITE_OTHER): Payer: Managed Care, Other (non HMO) | Admitting: Family Medicine

## 2016-02-01 ENCOUNTER — Encounter: Payer: Self-pay | Admitting: Family Medicine

## 2016-02-01 VITALS — BP 130/72 | HR 85 | Temp 98.2°F | Ht 67.0 in | Wt 188.2 lb

## 2016-02-01 DIAGNOSIS — E785 Hyperlipidemia, unspecified: Secondary | ICD-10-CM

## 2016-02-01 DIAGNOSIS — R7303 Prediabetes: Secondary | ICD-10-CM

## 2016-02-01 DIAGNOSIS — E669 Obesity, unspecified: Secondary | ICD-10-CM | POA: Diagnosis not present

## 2016-02-01 DIAGNOSIS — Z23 Encounter for immunization: Secondary | ICD-10-CM | POA: Diagnosis not present

## 2016-02-01 DIAGNOSIS — Z130 Encounter for screening for diseases of the blood and blood-forming organs and certain disorders involving the immune mechanism: Secondary | ICD-10-CM | POA: Diagnosis not present

## 2016-02-01 DIAGNOSIS — Z01419 Encounter for gynecological examination (general) (routine) without abnormal findings: Secondary | ICD-10-CM | POA: Insufficient documentation

## 2016-02-01 DIAGNOSIS — Z1239 Encounter for other screening for malignant neoplasm of breast: Secondary | ICD-10-CM

## 2016-02-01 DIAGNOSIS — E039 Hypothyroidism, unspecified: Secondary | ICD-10-CM

## 2016-02-01 LAB — COMPREHENSIVE METABOLIC PANEL
ALT: 20 U/L (ref 0–35)
AST: 19 U/L (ref 0–37)
Albumin: 4 g/dL (ref 3.5–5.2)
Alkaline Phosphatase: 47 U/L (ref 39–117)
BUN: 19 mg/dL (ref 6–23)
CHLORIDE: 103 meq/L (ref 96–112)
CO2: 29 meq/L (ref 19–32)
CREATININE: 0.99 mg/dL (ref 0.40–1.20)
Calcium: 9.6 mg/dL (ref 8.4–10.5)
GFR: 59.84 mL/min — ABNORMAL LOW (ref >60.00)
Glucose, Bld: 93 mg/dL (ref 70–99)
POTASSIUM: 4.1 meq/L (ref 3.5–5.1)
SODIUM: 140 meq/L (ref 135–145)
Total Bilirubin: 0.7 mg/dL (ref 0.2–1.2)
Total Protein: 7.1 g/dL (ref 6.0–8.3)

## 2016-02-01 LAB — CBC
HEMATOCRIT: 43 % (ref 36.0–46.0)
Hemoglobin: 14.8 g/dL (ref 12.0–15.0)
MCHC: 34.4 g/dL (ref 30.0–36.0)
MCV: 89.4 fl (ref 78.0–100.0)
Platelets: 248 10*3/uL (ref 150.0–400.0)
RBC: 4.81 Mil/uL (ref 3.87–5.11)
RDW: 13.4 % (ref 11.5–15.5)
WBC: 5.1 10*3/uL (ref 4.0–10.5)

## 2016-02-01 LAB — LIPID PANEL
Cholesterol: 231 mg/dL — ABNORMAL HIGH (ref 0–200)
HDL: 76.3 mg/dL (ref >39.00)
LDL CALC: 135 mg/dL — AB (ref 0–99)
NONHDL: 154.51
Total CHOL/HDL Ratio: 3
Triglycerides: 98 mg/dL (ref 0.0–149.0)
VLDL: 19.6 mg/dL (ref 0.0–40.0)

## 2016-02-01 LAB — TSH: TSH: 1.98 u[IU]/mL (ref 0.35–4.50)

## 2016-02-01 LAB — HEMOGLOBIN A1C: Hgb A1c MFr Bld: 5.7 % (ref 4.6–6.5)

## 2016-02-01 MED ORDER — CITALOPRAM HYDROBROMIDE 10 MG PO TABS
10.0000 mg | ORAL_TABLET | Freq: Every day | ORAL | 3 refills | Status: DC
Start: 2016-02-01 — End: 2017-03-24

## 2016-02-01 NOTE — Assessment & Plan Note (Signed)
Pap smear no longer needed. Pelvic exam performed today - normal. Normal breast exam. Arranging mammogram. Labs today. Flu shot given. Colonoscopy up-to-date. Patient is recently tapered off Wellbutrin and restarted Celexa. Refilled today.

## 2016-02-01 NOTE — Patient Instructions (Signed)
Follow up annually or sooner if needed   Take care  Dr. Yvett Rossel   Health Maintenance, Female Adopting a healthy lifestyle and getting preventive care can go a long way to promote health and wellness. Talk with your health care provider about what schedule of regular examinations is right for you. This is a good chance for you to check in with your provider about disease prevention and staying healthy. In between checkups, there are plenty of things you can do on your own. Experts have done a lot of research about which lifestyle changes and preventive measures are most likely to keep you healthy. Ask your health care provider for more information. WEIGHT AND DIET  Eat a healthy diet  Be sure to include plenty of vegetables, fruits, low-fat dairy products, and lean protein.  Do not eat a lot of foods high in solid fats, added sugars, or salt.  Get regular exercise. This is one of the most important things you can do for your health.  Most adults should exercise for at least 150 minutes each week. The exercise should increase your heart rate and make you sweat (moderate-intensity exercise).  Most adults should also do strengthening exercises at least twice a week. This is in addition to the moderate-intensity exercise.  Maintain a healthy weight  Body mass index (BMI) is a measurement that can be used to identify possible weight problems. It estimates body fat based on height and weight. Your health care provider can help determine your BMI and help you achieve or maintain a healthy weight.  For females 20 years of age and older:   A BMI below 18.5 is considered underweight.  A BMI of 18.5 to 24.9 is normal.  A BMI of 25 to 29.9 is considered overweight.  A BMI of 30 and above is considered obese.  Watch levels of cholesterol and blood lipids  You should start having your blood tested for lipids and cholesterol at 65 years of age, then have this test every 5 years.  You may need  to have your cholesterol levels checked more often if:  Your lipid or cholesterol levels are high.  You are older than 65 years of age.  You are at high risk for heart disease.  CANCER SCREENING   Lung Cancer  Lung cancer screening is recommended for adults 55-80 years old who are at high risk for lung cancer because of a history of smoking.  A yearly low-dose CT scan of the lungs is recommended for people who:  Currently smoke.  Have quit within the past 15 years.  Have at least a 30-pack-year history of smoking. A pack year is smoking an average of one pack of cigarettes a day for 1 year.  Yearly screening should continue until it has been 15 years since you quit.  Yearly screening should stop if you develop a health problem that would prevent you from having lung cancer treatment.  Breast Cancer  Practice breast self-awareness. This means understanding how your breasts normally appear and feel.  It also means doing regular breast self-exams. Let your health care provider know about any changes, no matter how small.  If you are in your 20s or 30s, you should have a clinical breast exam (CBE) by a health care provider every 1-3 years as part of a regular health exam.  If you are 40 or older, have a CBE every year. Also consider having a breast X-ray (mammogram) every year.  If you have a family   of breast cancer, talk to your health care provider about genetic screening.  If you are at high risk for breast cancer, talk to your health care provider about having an MRI and a mammogram every year.  Breast cancer gene (BRCA) assessment is recommended for women who have family members with BRCA-related cancers. BRCA-related cancers include:  Breast.  Ovarian.  Tubal.  Peritoneal cancers.  Results of the assessment will determine the need for genetic counseling and BRCA1 and BRCA2 testing. Cervical Cancer Your health care provider may recommend that you be  screened regularly for cancer of the pelvic organs (ovaries, uterus, and vagina). This screening involves a pelvic examination, including checking for microscopic changes to the surface of your cervix (Pap test). You may be encouraged to have this screening done every 3 years, beginning at age 39.  For women ages 39-65, health care providers may recommend pelvic exams and Pap testing every 3 years, or they may recommend the Pap and pelvic exam, combined with testing for human papilloma virus (HPV), every 5 years. Some types of HPV increase your risk of cervical cancer. Testing for HPV may also be done on women of any age with unclear Pap test results.  Other health care providers may not recommend any screening for nonpregnant women who are considered low risk for pelvic cancer and who do not have symptoms. Ask your health care provider if a screening pelvic exam is right for you.  If you have had past treatment for cervical cancer or a condition that could lead to cancer, you need Pap tests and screening for cancer for at least 20 years after your treatment. If Pap tests have been discontinued, your risk factors (such as having a new sexual partner) need to be reassessed to determine if screening should resume. Some women have medical problems that increase the chance of getting cervical cancer. In these cases, your health care provider may recommend more frequent screening and Pap tests. Colorectal Cancer  This type of cancer can be detected and often prevented.  Routine colorectal cancer screening usually begins at 65 years of age and continues through 65 years of age.  Your health care provider may recommend screening at an earlier age if you have risk factors for colon cancer.  Your health care provider may also recommend using home test kits to check for hidden blood in the stool.  A small camera at the end of a tube can be used to examine your colon directly (sigmoidoscopy or colonoscopy).  This is done to check for the earliest forms of colorectal cancer.  Routine screening usually begins at age 70.  Direct examination of the colon should be repeated every 5-10 years through 65 years of age. However, you may need to be screened more often if early forms of precancerous polyps or small growths are found. Skin Cancer  Check your skin from head to toe regularly.  Tell your health care provider about any new moles or changes in moles, especially if there is a change in a mole's shape or color.  Also tell your health care provider if you have a mole that is larger than the size of a pencil eraser.  Always use sunscreen. Apply sunscreen liberally and repeatedly throughout the day.  Protect yourself by wearing long sleeves, pants, a wide-brimmed hat, and sunglasses whenever you are outside. HEART DISEASE, DIABETES, AND HIGH BLOOD PRESSURE   High blood pressure causes heart disease and increases the risk of stroke. High blood pressure is  more likely to develop in:  People who have blood pressure in the high end of the normal range (130-139/85-89 mm Hg).  People who are overweight or obese.  People who are African American.  If you are 11-85 years of age, have your blood pressure checked every 3-5 years. If you are 84 years of age or older, have your blood pressure checked every year. You should have your blood pressure measured twice--once when you are at a hospital or clinic, and once when you are not at a hospital or clinic. Record the average of the two measurements. To check your blood pressure when you are not at a hospital or clinic, you can use:  An automated blood pressure machine at a pharmacy.  A home blood pressure monitor.  If you are between 23 years and 12 years old, ask your health care provider if you should take aspirin to prevent strokes.  Have regular diabetes screenings. This involves taking a blood sample to check your fasting blood sugar level.  If you  are at a normal weight and have a low risk for diabetes, have this test once every three years after 65 years of age.  If you are overweight and have a high risk for diabetes, consider being tested at a younger age or more often. PREVENTING INFECTION  Hepatitis B  If you have a higher risk for hepatitis B, you should be screened for this virus. You are considered at high risk for hepatitis B if:  You were born in a country where hepatitis B is common. Ask your health care provider which countries are considered high risk.  Your parents were born in a high-risk country, and you have not been immunized against hepatitis B (hepatitis B vaccine).  You have HIV or AIDS.  You use needles to inject street drugs.  You live with someone who has hepatitis B.  You have had sex with someone who has hepatitis B.  You get hemodialysis treatment.  You take certain medicines for conditions, including cancer, organ transplantation, and autoimmune conditions. Hepatitis C  Blood testing is recommended for:  Everyone born from [birth year] through 1965.  Anyone with known risk factors for hepatitis C. Sexually transmitted infections (STIs)  You should be screened for sexually transmitted infections (STIs) including gonorrhea and chlamydia if:  You are sexually active and are younger than 65 years of age.  You are older than 65 years of age and your health care provider tells you that you are at risk for this type of infection.  Your sexual activity has changed since you were last screened and you are at an increased risk for chlamydia or gonorrhea. Ask your health care provider if you are at risk.  If you do not have HIV, but are at risk, it may be recommended that you take a prescription medicine daily to prevent HIV infection. This is called pre-exposure prophylaxis (PrEP). You are considered at risk if:  You are sexually active and do not regularly use condoms or know the HIV status of your  partner(s).  You take drugs by injection.  You are sexually active with a partner who has HIV. Talk with your health care provider about whether you are at high risk of being infected with HIV. If you choose to begin PrEP, you should first be tested for HIV. You should then be tested every 3 months for as long as you are taking PrEP.  PREGNANCY   If you are premenopausal and you  may become pregnant, ask your health care provider about preconception counseling.  If you may become pregnant, take 400 to 800 micrograms (mcg) of folic acid every day.  If you want to prevent pregnancy, talk to your health care provider about birth control (contraception). OSTEOPOROSIS AND MENOPAUSE   Osteoporosis is a disease in which the bones lose minerals and strength with aging. This can result in serious bone fractures. Your risk for osteoporosis can be identified using a bone density scan.  If you are 75 years of age or older, or if you are at risk for osteoporosis and fractures, ask your health care provider if you should be screened.  Ask your health care provider whether you should take a calcium or vitamin D supplement to lower your risk for osteoporosis.  Menopause may have certain physical symptoms and risks.  Hormone replacement therapy may reduce some of these symptoms and risks. Talk to your health care provider about whether hormone replacement therapy is right for you.  HOME CARE INSTRUCTIONS   Schedule regular health, dental, and eye exams.  Stay current with your immunizations.   Do not use any tobacco products including cigarettes, chewing tobacco, or electronic cigarettes.  If you are pregnant, do not drink alcohol.  If you are breastfeeding, limit how much and how often you drink alcohol.  Limit alcohol intake to no more than 1 drink per day for nonpregnant women. One drink equals 12 ounces of beer, 5 ounces of wine, or 1 ounces of hard liquor.  Do not use street drugs.  Do  not share needles.  Ask your health care provider for help if you need support or information about quitting drugs.  Tell your health care provider if you often feel depressed.  Tell your health care provider if you have ever been abused or do not feel safe at home.   This information is not intended to replace advice given to you by your health care provider. Make sure you discuss any questions you have with your health care provider.   Document Released: 11/11/2010 Document Revised: 05/19/2014 Document Reviewed: 03/30/2013 Elsevier Interactive Patient Education [employer].

## 2016-02-01 NOTE — Progress Notes (Signed)
Subjective:  Patient ID: Sarah Jacobson, female    DOB: Oct 18, 1950  Age: 65 y.o. MRN: [id number]  CC: Annual exam.  HPI Sarah Jacobson is a 65 y.o. female presents to the clinic today for an annual exam.  Preventative Healthcare  Pap smear: Not needed as patient is status post hysterectomy for benign reasons. She would like a pelvic exam today.  Mammogram: In need of mammogram. Will order.  Colonoscopy: Up to date.  Immunizations  Tetanus - Up to date.  Pneumococcal - Not indicated at this time.  Flu - Wants flu shot today.  Labs: In need of labs today.  Alcohol use: See below.  Smoking/tobacco use: Nonsmoker.   Regular dental exams: Yes.  PMH, Surgical Hx, Family Hx, Social History reviewed and updated as below.  Past Medical History:  Diagnosis Date  . Anxiety   . GERD (gastroesophageal reflux disease)   . Hyperlipidemia   . Thyroid disease    Past Surgical History:  Procedure Laterality Date  . ABDOMINAL HYSTERECTOMY  1982/2000   uterus/ovaries  . ROTATOR CUFF REPAIR  1986   Both shoulders  . TONSILLECTOMY AND ADENOIDECTOMY  1992   Family History  Problem Relation Age of Onset  . Cancer Father     Lung Cancer- smoker   Social History  Substance Use Topics  . Smoking status: Never Smoker  . Smokeless tobacco: Never Used  . Alcohol use 0.6 oz/week    1 Glasses of wine per week   Review of Systems  HENT: Positive for congestion.   Respiratory: Positive for cough.   Psychiatric/Behavioral: The patient is nervous/anxious.   All other systems reviewed and are negative.  Objective:   Today's Vitals: BP 130/72 (BP Location: Right Arm, Patient Position: Sitting, Cuff Size: Normal)   Pulse 85   Temp 98.2 F (36.8 C) (Oral)   Ht _0  (1.702 m)   Wt 188 lb 4 oz (85.4 kg)   SpO2 96%   BMI 29.48 kg/m   Physical Exam  Constitutional: She is oriented to person, place, and time. She appears well-developed and well-nourished. No distress.  HENT:    Head: Normocephalic and atraumatic.  Nose: Nose normal.  Mouth/Throat: Oropharynx is clear and moist. No oropharyngeal exudate.  Normal TM's bilaterally.   Eyes: Conjunctivae are normal. No scleral icterus.  Neck: Neck supple. No thyromegaly present.  Cardiovascular: Normal rate and regular rhythm.   No murmur heard. Pulmonary/Chest: Effort normal and breath sounds normal. She has no wheezes. She has no rales.  Abdominal: Soft. She exhibits no distension. There is no tenderness. There is no rebound and no guarding.  Musculoskeletal: Normal range of motion. She exhibits no edema.  Lymphadenopathy:    She has no cervical adenopathy.  Neurological: She is alert and oriented to person, place, and time.  Skin: Skin is warm and dry. No rash noted.  Psychiatric: She has a normal mood and affect.  Vitals reviewed. Breasts: breasts appear normal, no appreciable masses, no skin or nipple changes or axillary nodes. Pelvic Exam:  External: normal female genitalia without lesions or masses  Vagina: normal without lesions or masses  Cervix: Absent.  Assessment & Plan:   Problem List Items Addressed This Visit    Hyperlipidemia   Relevant Orders   Lipid Profile   Hypothyroidism   Relevant Orders   TSH   Obese   Well woman exam with routine gynecological exam - Primary    Pap smear no longer needed. Pelvic  exam performed today - normal. Normal breast exam. Arranging mammogram. Labs today. Flu shot given. Colonoscopy up-to-date. Patient is recently tapered off Wellbutrin and restarted Celexa. Refilled today.       Other Visit Diagnoses    Encounter for immunization       Relevant Orders   Flu Vaccine QUAD 36+ mos IM (Completed)   Prediabetes       Relevant Orders   Comp Met (CMET)   HgB A1c   Screening for deficiency anemia       Relevant Orders   CBC   Encounter for immunization       Relevant Medications   citalopram (CELEXA) 10 MG tablet   Screening for breast cancer        Relevant Orders   MM DIGITAL SCREENING BILATERAL      Outpatient Encounter Prescriptions as of 02/01/2016  Medication Sig  . Calcium Carb-Cholecalciferol (CALCIUM 600 + D PO) Take 1 tablet by mouth daily.  . Cholecalciferol (VITAMIN D) 2000 UNITS tablet Take 2,000 Units by mouth daily.  . citalopram (CELEXA) 10 MG tablet Take 1 tablet (10 mg total) by mouth daily.  . Cyanocobalamin (VITAMIN B 12 PO) Take 1 tablet by mouth daily.  Marland Kitchen estradiol (ESTRACE) 0.1 MG/GM vaginal cream Place 1 Applicatorful vaginally 3 (three) times a week.  . fexofenadine (ALLEGRA) 180 MG tablet Take 0.5 tablets (90 mg total) by mouth as needed for allergies or rhinitis.  . fluticasone (FLONASE) 50 MCG/ACT nasal spray Place 1 spray into both nostrils as needed for allergies or rhinitis.  Marland Kitchen levothyroxine (SYNTHROID, LEVOTHROID) 75 MCG tablet TAKE ONE TABLET EVERY MORNING BEFORE BREAKFAST  . Misc Natural Products (COSAMIN ASU ADVANCED FORMULA PO) Take 1 tablet by mouth daily.  . Multiple Vitamin (MULTI VITAMIN DAILY PO) Take 1 tablet by mouth daily.  . [DISCONTINUED] citalopram (CELEXA) 10 MG tablet Take 10 mg by mouth daily.  . [DISCONTINUED] buPROPion (WELLBUTRIN XL) 300 MG 24 hr tablet TAKE ONE (1) TABLET EACH DAY  . [DISCONTINUED] progesterone (PROMETRIUM) 100 MG capsule Take 100 mg by mouth daily.   No facility-administered encounter medications on file as of 02/01/2016.     Follow-up: Annually.   [place]

## 2016-02-01 NOTE — Progress Notes (Signed)
Pre visit review using our clinic review tool, if applicable. No additional management support is needed unless otherwise documented below in the visit note. 

## 2016-02-04 ENCOUNTER — Telehealth: Payer: Self-pay | Admitting: Family Medicine

## 2016-02-04 NOTE — Telephone Encounter (Signed)
Pt called returning your call regarding lab results.  Thank you!  Call pt at [phone]

## 2016-02-04 NOTE — Telephone Encounter (Signed)
Please call back

## 2016-02-04 NOTE — Telephone Encounter (Signed)
Results given to patient

## 2016-02-19 ENCOUNTER — Ambulatory Visit
Admission: RE | Admit: 2016-02-19 | Discharge: 2016-02-19 | Disposition: A | Discharge status: Home / Self Care | Payer: Medicare Other | Source: Ambulatory Visit | Attending: Family Medicine | Admitting: Family Medicine

## 2016-02-19 DIAGNOSIS — Z1239 Encounter for other screening for malignant neoplasm of breast: Secondary | ICD-10-CM

## 2016-02-19 DIAGNOSIS — Z1231 Encounter for screening mammogram for malignant neoplasm of breast: Secondary | ICD-10-CM | POA: Insufficient documentation

## 2016-05-22 DIAGNOSIS — M7541 Impingement syndrome of right shoulder: Secondary | ICD-10-CM | POA: Diagnosis not present

## 2016-05-22 DIAGNOSIS — M19019 Primary osteoarthritis, unspecified shoulder: Secondary | ICD-10-CM | POA: Diagnosis not present

## 2016-06-18 DIAGNOSIS — H2513 Age-related nuclear cataract, bilateral: Secondary | ICD-10-CM | POA: Diagnosis not present

## 2016-06-18 DIAGNOSIS — H35371 Puckering of macula, right eye: Secondary | ICD-10-CM | POA: Diagnosis not present

## 2016-07-03 DIAGNOSIS — H2513 Age-related nuclear cataract, bilateral: Secondary | ICD-10-CM | POA: Diagnosis not present

## 2016-07-08 ENCOUNTER — Encounter: Payer: Self-pay | Admitting: *Deleted

## 2016-07-29 NOTE — H&P (Signed)
See scanned note.

## 2016-07-30 ENCOUNTER — Encounter: Payer: Self-pay | Admitting: *Deleted

## 2016-07-30 ENCOUNTER — Encounter
Admission: RE | Disposition: A | Discharge status: Home / Self Care | Payer: Self-pay | Source: Ambulatory Visit | Attending: Ophthalmology

## 2016-07-30 ENCOUNTER — Ambulatory Visit
Admission: RE | Admit: 2016-07-30 | Discharge: 2016-07-30 | Disposition: A | Discharge status: Home / Self Care | Payer: Medicare Other | Source: Ambulatory Visit | Attending: Ophthalmology | Admitting: Ophthalmology

## 2016-07-30 ENCOUNTER — Ambulatory Visit: Payer: Medicare Other | Admitting: Anesthesiology

## 2016-07-30 DIAGNOSIS — F419 Anxiety disorder, unspecified: Secondary | ICD-10-CM | POA: Insufficient documentation

## 2016-07-30 DIAGNOSIS — K219 Gastro-esophageal reflux disease without esophagitis: Secondary | ICD-10-CM | POA: Insufficient documentation

## 2016-07-30 DIAGNOSIS — H2511 Age-related nuclear cataract, right eye: Secondary | ICD-10-CM | POA: Diagnosis not present

## 2016-07-30 DIAGNOSIS — H2513 Age-related nuclear cataract, bilateral: Secondary | ICD-10-CM | POA: Diagnosis not present

## 2016-07-30 DIAGNOSIS — E785 Hyperlipidemia, unspecified: Secondary | ICD-10-CM | POA: Diagnosis not present

## 2016-07-30 DIAGNOSIS — I252 Old myocardial infarction: Secondary | ICD-10-CM | POA: Insufficient documentation

## 2016-07-30 DIAGNOSIS — E1136 Type 2 diabetes mellitus with diabetic cataract: Secondary | ICD-10-CM | POA: Diagnosis not present

## 2016-07-30 DIAGNOSIS — E039 Hypothyroidism, unspecified: Secondary | ICD-10-CM | POA: Diagnosis not present

## 2016-07-30 HISTORY — DX: Hypothyroidism, unspecified: E03.9

## 2016-07-30 HISTORY — DX: Other complications of anesthesia, initial encounter: T88.59XA

## 2016-07-30 HISTORY — DX: Other specified postprocedural states: Z98.890

## 2016-07-30 HISTORY — DX: Other specified postprocedural states: R11.2

## 2016-07-30 HISTORY — PX: CATARACT EXTRACTION W/PHACO: SHX586

## 2016-07-30 HISTORY — DX: Adverse effect of unspecified anesthetic, initial encounter: T41.45XA

## 2016-07-30 SURGERY — PHACOEMULSIFICATION, CATARACT, WITH IOL INSERTION
Anesthesia: Monitor Anesthesia Care | Site: Eye | Laterality: Right | Wound class: Clean

## 2016-07-30 MED ORDER — CYCLOPENTOLATE HCL 2 % OP SOLN
1.0000 [drp] | OPHTHALMIC | Status: AC
  Administered 2016-07-30 (×4): 1 [drp] via OPHTHALMIC

## 2016-07-30 MED ORDER — MIDAZOLAM HCL 2 MG/2ML IJ SOLN
INTRAMUSCULAR | Status: AC
  Filled 2016-07-30: qty 2

## 2016-07-30 MED ORDER — LIDOCAINE HCL (PF) 4 % IJ SOLN
INTRAMUSCULAR | Status: DC | PRN
  Administered 2016-07-30: 4 mL via OPHTHALMIC

## 2016-07-30 MED ORDER — EPINEPHRINE PF 1 MG/ML IJ SOLN
INTRAMUSCULAR | Status: AC
  Filled 2016-07-30: qty 2

## 2016-07-30 MED ORDER — TETRACAINE HCL 0.5 % OP SOLN
OPHTHALMIC | Status: DC | PRN
  Administered 2016-07-30: 1 [drp] via OPHTHALMIC

## 2016-07-30 MED ORDER — POVIDONE-IODINE 5 % OP SOLN
OPHTHALMIC | Status: DC | PRN
  Administered 2016-07-30: 1 via OPHTHALMIC

## 2016-07-30 MED ORDER — HYALURONIDASE HUMAN 150 UNIT/ML IJ SOLN
INTRAMUSCULAR | Status: AC
Start: 2016-07-30 — End: ?
  Filled 2016-07-30: qty 1

## 2016-07-30 MED ORDER — TETRACAINE HCL 0.5 % OP SOLN
OPHTHALMIC | Status: AC
  Filled 2016-07-30: qty 2

## 2016-07-30 MED ORDER — MIDAZOLAM HCL 2 MG/2ML IJ SOLN
INTRAMUSCULAR | Status: DC | PRN
  Administered 2016-07-30: 1 mg via INTRAVENOUS
  Administered 2016-07-30 (×2): 0.5 mg via INTRAVENOUS

## 2016-07-30 MED ORDER — MOXIFLOXACIN HCL 0.5 % OP SOLN
1.0000 [drp] | OPHTHALMIC | Status: AC
  Administered 2016-07-30 (×3): 1 [drp] via OPHTHALMIC

## 2016-07-30 MED ORDER — MOXIFLOXACIN HCL 0.5 % OP SOLN
OPHTHALMIC | Status: AC
  Filled 2016-07-30: qty 3

## 2016-07-30 MED ORDER — CEFUROXIME OPHTHALMIC INJECTION 1 MG/0.1 ML
INJECTION | OPHTHALMIC | Status: AC
  Filled 2016-07-30: qty 0.1

## 2016-07-30 MED ORDER — NA CHONDROIT SULF-NA HYALURON 40-17 MG/ML IO SOLN
INTRAOCULAR | Status: DC | PRN
  Administered 2016-07-30: 1 mL via INTRAOCULAR

## 2016-07-30 MED ORDER — SODIUM CHLORIDE 0.9 % IV SOLN
INTRAVENOUS | Status: DC
  Administered 2016-07-30 (×2): via INTRAVENOUS

## 2016-07-30 MED ORDER — PHENYLEPHRINE HCL 10 % OP SOLN
1.0000 [drp] | OPHTHALMIC | Status: AC
  Administered 2016-07-30 (×4): 1 [drp] via OPHTHALMIC

## 2016-07-30 MED ORDER — EPINEPHRINE PF 1 MG/ML IJ SOLN
INTRAMUSCULAR | Status: DC | PRN
  Administered 2016-07-30: 1 mL via OPHTHALMIC

## 2016-07-30 MED ORDER — NA CHONDROIT SULF-NA HYALURON 40-17 MG/ML IO SOLN
INTRAOCULAR | Status: AC
  Filled 2016-07-30: qty 1

## 2016-07-30 MED ORDER — POVIDONE-IODINE 5 % OP SOLN
OPHTHALMIC | Status: AC
  Filled 2016-07-30: qty 30

## 2016-07-30 MED ORDER — LIDOCAINE HCL (PF) 4 % IJ SOLN
INTRAOCULAR | Status: DC | PRN
  Administered 2016-07-30: 2.25 mL via OPHTHALMIC

## 2016-07-30 MED ORDER — ALFENTANIL 500 MCG/ML IJ INJ
INJECTION | INTRAMUSCULAR | Status: DC | PRN
  Administered 2016-07-30: 500 ug via INTRAVENOUS
  Administered 2016-07-30: 250 ug via INTRAVENOUS

## 2016-07-30 MED ORDER — CARBACHOL 0.01 % IO SOLN
INTRAOCULAR | Status: DC | PRN
  Administered 2016-07-30: .5 mL via INTRAOCULAR

## 2016-07-30 MED ORDER — MOXIFLOXACIN HCL 0.5 % OP SOLN
OPHTHALMIC | Status: DC | PRN
  Administered 2016-07-30: .2 mL via OPHTHALMIC

## 2016-07-30 MED ORDER — BUPIVACAINE HCL (PF) 0.75 % IJ SOLN
INTRAMUSCULAR | Status: AC
  Filled 2016-07-30: qty 10

## 2016-07-30 SURGICAL SUPPLY — 29 items

## 2016-07-30 NOTE — Discharge Instructions (Signed)
Eye Surgery Discharge Instructions  Expect mild scratchy sensation or mild soreness. DO NOT RUB YOUR EYE!  The day of surgery:  Minimal physical activity, but bed rest is not required  No reading, computer work, or close hand work  No bending, lifting, or straining.  May watch TV  For 24 hours:  No driving, legal decisions, or alcoholic beverages  Safety precautions  Eat anything you prefer: It is better to start with liquids, then soup then solid foods.  _____ Eye patch should be worn until postoperative exam tomorrow.  ____ Solar shield eyeglasses should be worn for comfort in the sunlight/patch while sleeping  Resume all regular medications including aspirin or Coumadin if these were discontinued prior to surgery. You may shower, bathe, shave, or wash your hair. Tylenol may be taken for mild discomfort.  Call your doctor if you experience significant pain, nausea, or vomiting, fever > 101 or other signs of infection. [phone] or [phone] Specific instructions:  Follow-up Information    Leshawn Straka, MD Follow up.   Specialty:  Ophthalmology Why:  March 22 at 9:15am Contact information: [address]   [place] [address] [phone]

## 2016-07-30 NOTE — Anesthesia Preprocedure Evaluation (Signed)
Anesthesia Evaluation  Patient identified by MRN, date of birth, ID band Patient awake    Reviewed: Allergy & Precautions, NPO status , Patient's Chart, lab work & pertinent test results  History of Anesthesia Complications (+) PONV and history of anesthetic complications  Airway Mallampati: II  TM Distance: >3 FB Neck ROM: Full    Dental no notable dental hx.    Pulmonary neg pulmonary ROS, neg sleep apnea, neg COPD,    breath sounds clear to auscultation- rhonchi (-) wheezing      Cardiovascular Exercise Tolerance: Good (-) hypertension(-) CAD and (-) Past MI  Rhythm:Regular Rate:Normal - Systolic murmurs and - Diastolic murmurs    Neuro/Psych PSYCHIATRIC DISORDERS Anxiety negative neurological ROS     GI/Hepatic Neg liver ROS, GERD  ,  Endo/Other  neg diabetesHypothyroidism   Renal/GU negative Renal ROS     Musculoskeletal negative musculoskeletal ROS (+)   Abdominal (+) - obese,   Peds  Hematology negative hematology ROS (+)   Anesthesia Other Findings Past Medical History: No date: Anxiety No date: Complication of anesthesia No date: GERD (gastroesophageal reflux disease) No date: Hyperlipidemia No date: Hypothyroidism No date: PONV (postoperative nausea and vomiting) No date: Thyroid disease   Reproductive/Obstetrics                             Anesthesia Physical Anesthesia Plan  ASA: II  Anesthesia Plan: MAC   Post-op Pain Management:    Induction: Intravenous  Airway Management Planned: Natural Airway  Additional Equipment:   Intra-op Plan:   Post-operative Plan:   Informed Consent: I have reviewed the patients History and Physical, chart, labs and discussed the procedure including the risks, benefits and alternatives for the proposed anesthesia with the patient or authorized representative who has indicated his/her understanding and acceptance.     Plan  Discussed with: CRNA and Anesthesiologist  Anesthesia Plan Comments:         Anesthesia Quick Evaluation

## 2016-07-30 NOTE — Op Note (Signed)
Date of Surgery: 07/30/2016 Date of Dictation: 07/30/2016 9:11 AM Pre-operative Diagnosis:  Nuclear Sclerotic Cataract right Eye Post-operative Diagnosis: same Procedure performed: Extra-capsular Cataract Extraction (ECCE) with placement of a posterior chamber intraocular lens (IOL) right Eye IOL:  Implant Name Type Inv. Item Serial No. Manufacturer Lot No. LRB No. Used  LENS IOL ACRYSOF IQ 21.0 - [id number] Intraocular Lens LENS IOL ACRYSOF IQ 21.0 [id number] ALCON   Right 1   Anesthesia: 2% Lidocaine and 4% Marcaine in a 50/50 mixture with 10 unites/ml of Hylenex given as a peribulbar Anesthesiologist: Anesthesiologist: Emmie Niemann, MD CRNA: Courtney Paris, CRNA Complications: none Estimated Blood Loss: less than 1 ml  Description of procedure:  The patient was given anesthesia and sedation via intravenous access. The patient was then prepped and draped in the usual fashion. A 25-gauge needle was bent for initiating the capsulorhexis. A 5-0 silk suture was placed through the conjunctiva superior and inferiorly to serve as bridle sutures. Hemostasis was obtained at the superior limbus using an eraser cautery. A partial thickness groove was made at the anterior surgical limbus with a 64 Beaver blade and this was dissected anteriorly with an [employer]aya. The anterior chamber was entered at 10 o'clock with a 1.0 mm paracentesis knife and through the lamellar dissection with a 2.6 mm Alcon keratome. Epi-Shugarcaine 0.5 CC [9 cc BSS Plus (Alcon), 3 cc 4% preservative-free lidocaine (Hospira) and 4 cc 1:1000 preservative-free, bisulfite-free epinephrine] was injected into the anterior chamber via the paracentesis tract. Epi-Shugarcaine 0.5 CC [9 cc BSS Plus (Alcon), 3 cc 4% preservative-free lidocaine (Hospira) and 4 cc 1:1000 preservative-free, bisulfite-free epinephrine] was injected into the anterior chamber via the paracentesis tract. DiscoVisc was injected to replace the aqueous and a  continuous tear curvilinear capsulorhexis was performed using a bent 25-gauge needle.  Balance salt on a syringe was used to perform hydro-dissection and phacoemulsification was carried out using a divide and conquer technique. Procedure(s) with comments: CATARACT EXTRACTION PHACO AND INTRAOCULAR LENS PLACEMENT (IOC) (Right) - [place] 02:01 AP% 26.3 CDE 58.46 fluid pack lot # [id number]. Irrigation/aspiration was used to remove the residual cortex and the capsular bag was inflated with DiscoVisc. The intraocular lens was inserted into the capsular bag using a pre-loaded UltraSert Delivery System. Irrigation/aspiration was used to remove the residual DiscoVisc. The wound was inflated with balanced salt and checked for leaks. None were found. Miostat was injected via the paracentesis track and 0.1 ml of Vigamox containing 1 mg of drug  was injected via the paracentesis track. The wound was checked for leaks again and none were found.   The bridal sutures were removed and two drops of Vigamox were placed on the eye. An eye shield was placed to protect the eye and the patient was discharged to the recovery area in good condition.   Towana Stenglein MD

## 2016-07-30 NOTE — Transfer of Care (Signed)
Immediate Anesthesia Transfer of Care Note  Patient: VIRJEAN BOMAN  Procedure(s) Performed: Procedure(s) with comments: CATARACT EXTRACTION PHACO AND INTRAOCULAR LENS PLACEMENT (IOC) (Right) - [place] 02:01 AP% 26.3 CDE 58.46 fluid pack lot # [id number]  Patient Location: PACU and Short Stay  Anesthesia Type:MAC  Level of Consciousness: awake, oriented and patient cooperative  Airway & Oxygen Therapy: Patient Spontanous Breathing  Post-op Assessment: Report given to RN and Post -op Vital signs reviewed and stable  Post vital signs: Reviewed and stable  Last Vitals:  Vitals:   07/30/16 0702  BP: 133/74  Pulse: 69  Resp: 16  Temp: 36.4 C    Last Pain:  Vitals:   07/30/16 0702  TempSrc: Oral         Complications: No apparent anesthesia complications

## 2016-07-30 NOTE — Anesthesia Postprocedure Evaluation (Signed)
Anesthesia Post Note  Patient: Sarah Jacobson  Procedure(s) Performed: Procedure(s) (LRB): CATARACT EXTRACTION PHACO AND INTRAOCULAR LENS PLACEMENT (IOC) (Right)  Patient location during evaluation: PACU Anesthesia Type: MAC Level of consciousness: awake and alert and oriented Pain management: pain level controlled Vital Signs Assessment: post-procedure vital signs reviewed and stable Respiratory status: spontaneous breathing, nonlabored ventilation and respiratory function stable Cardiovascular status: stable and blood pressure returned to baseline Anesthetic complications: no     Last Vitals:  Vitals:   07/30/16 0915 07/30/16 0917  BP:  120/60  Pulse:    Resp: 16   Temp: 36.6 C     Last Pain:  Vitals:   07/30/16 0915  TempSrc: Temporal                 Celesta Funderburk

## 2016-07-30 NOTE — Interval H&P Note (Signed)
History and Physical Interval Note:  07/30/2016 7:28 AM  Sarah Jacobson  has presented today for surgery, with the diagnosis of cataract  The various methods of treatment have been discussed with the patient and family. After consideration of risks, benefits and other options for treatment, the patient has consented to  Procedure(s): CATARACT EXTRACTION PHACO AND INTRAOCULAR LENS PLACEMENT ([place]) (Right) as a surgical intervention .  The patient's history has been reviewed, patient examined, no change in status, stable for surgery.  I have reviewed the patient's chart and labs.  Questions were answered to the patient's satisfaction.     Marshall Kampf

## 2016-07-30 NOTE — Anesthesia Post-op Follow-up Note (Cosign Needed)
Anesthesia QCDR form completed.        

## 2016-07-31 ENCOUNTER — Encounter: Payer: Self-pay | Admitting: Ophthalmology

## 2016-09-22 DIAGNOSIS — M7541 Impingement syndrome of right shoulder: Secondary | ICD-10-CM | POA: Diagnosis not present

## 2016-10-21 ENCOUNTER — Other Ambulatory Visit: Payer: Self-pay | Admitting: Family Medicine

## 2016-10-24 DIAGNOSIS — H35371 Puckering of macula, right eye: Secondary | ICD-10-CM | POA: Diagnosis not present

## 2016-11-21 ENCOUNTER — Other Ambulatory Visit: Payer: Self-pay

## 2016-11-21 MED ORDER — FLUTICASONE PROPIONATE 50 MCG/ACT NA SUSP: 1.0000 | NASAL | 1 refills | Status: DC | PRN

## 2017-02-20 DIAGNOSIS — H2512 Age-related nuclear cataract, left eye: Secondary | ICD-10-CM | POA: Diagnosis not present

## 2017-02-24 ENCOUNTER — Telehealth: Payer: Self-pay | Admitting: Family Medicine

## 2017-02-24 DIAGNOSIS — Z1329 Encounter for screening for other suspected endocrine disorder: Secondary | ICD-10-CM

## 2017-02-24 DIAGNOSIS — Z130 Encounter for screening for diseases of the blood and blood-forming organs and certain disorders involving the immune mechanism: Secondary | ICD-10-CM

## 2017-02-24 DIAGNOSIS — Z13220 Encounter for screening for lipoid disorders: Secondary | ICD-10-CM

## 2017-02-24 DIAGNOSIS — E663 Overweight: Secondary | ICD-10-CM

## 2017-02-24 NOTE — Telephone Encounter (Signed)
Pt would like to have labs done before her aCPE appt. Please and Thank you!  Call pt @ [phone].

## 2017-02-24 NOTE — Telephone Encounter (Signed)
Please advise 

## 2017-02-24 NOTE — Telephone Encounter (Signed)
Orders placed.

## 2017-02-25 NOTE — Telephone Encounter (Signed)
Noted. Will defer labs to new PCP.

## 2017-02-25 NOTE — Telephone Encounter (Signed)
Patient changed to establish care at [facility]

## 2017-03-24 ENCOUNTER — Encounter: Payer: Self-pay | Admitting: Internal Medicine

## 2017-03-24 ENCOUNTER — Ambulatory Visit (INDEPENDENT_AMBULATORY_CARE_PROVIDER_SITE_OTHER): Payer: Medicare Other | Admitting: Internal Medicine

## 2017-03-24 VITALS — BP 122/78 | HR 66 | Temp 97.6°F | Wt 194.0 lb

## 2017-03-24 DIAGNOSIS — E039 Hypothyroidism, unspecified: Secondary | ICD-10-CM

## 2017-03-24 DIAGNOSIS — F419 Anxiety disorder, unspecified: Secondary | ICD-10-CM | POA: Diagnosis not present

## 2017-03-24 DIAGNOSIS — J302 Other seasonal allergic rhinitis: Secondary | ICD-10-CM | POA: Diagnosis not present

## 2017-03-24 DIAGNOSIS — Z23 Encounter for immunization: Secondary | ICD-10-CM

## 2017-03-24 DIAGNOSIS — E7800 Pure hypercholesterolemia, unspecified: Secondary | ICD-10-CM | POA: Diagnosis not present

## 2017-03-24 NOTE — Assessment & Plan Note (Signed)
Will check TSH and Free T4 today Will adjust and refill Synthroid as needed based on labs

## 2017-03-24 NOTE — Patient Instructions (Signed)
Health Maintenance for Postmenopausal Women Menopause is a normal process in which your reproductive ability comes to an end. This process happens gradually over a span of months to years, usually between the ages of 22 and 9. Menopause is complete when you have missed 12 consecutive menstrual periods. It is important to talk with your health care provider about some of the most common conditions that affect postmenopausal women, such as heart disease, cancer, and bone loss (osteoporosis). Adopting a healthy lifestyle and getting preventive care can help to promote your health and wellness. Those actions can also lower your chances of developing some of these common conditions. What should I know about menopause? During menopause, you may experience a number of symptoms, such as:  Moderate-to-severe hot flashes.  Night sweats.  Decrease in sex drive.  Mood swings.  Headaches.  Tiredness.  Irritability.  Memory problems.  Insomnia.  Choosing to treat or not to treat menopausal changes is an individual decision that you make with your health care provider. What should I know about hormone replacement therapy and supplements? Hormone therapy products are effective for treating symptoms that are associated with menopause, such as hot flashes and night sweats. Hormone replacement carries certain risks, especially as you become older. If you are thinking about using estrogen or estrogen with progestin treatments, discuss the benefits and risks with your health care provider. What should I know about heart disease and stroke? Heart disease, heart attack, and stroke become more likely as you age. This may be due, in part, to the hormonal changes that your body experiences during menopause. These can affect how your body processes dietary fats, triglycerides, and cholesterol. Heart attack and stroke are both medical emergencies. There are many things that you can do to help prevent heart disease  and stroke:  Have your blood pressure checked at least every 1-2 years. High blood pressure causes heart disease and increases the risk of stroke.  If you are 53-22 years old, ask your health care provider if you should take aspirin to prevent a heart attack or a stroke.  Do not use any tobacco products, including cigarettes, chewing tobacco, or electronic cigarettes. If you need help quitting, ask your health care provider.  It is important to eat a healthy diet and maintain a healthy weight. ? Be sure to include plenty of vegetables, fruits, low-fat dairy products, and lean protein. ? Avoid eating foods that are high in solid fats, added sugars, or salt (sodium).  Get regular exercise. This is one of the most important things that you can do for your health. ? Try to exercise for at least 150 minutes each week. The type of exercise that you do should increase your heart rate and make you sweat. This is known as moderate-intensity exercise. ? Try to do strengthening exercises at least twice each week. Do these in addition to the moderate-intensity exercise.  Know your numbers.Ask your health care provider to check your cholesterol and your blood glucose. Continue to have your blood tested as directed by your health care provider.  What should I know about cancer screening? There are several types of cancer. Take the following steps to reduce your risk and to catch any cancer development as early as possible. Breast Cancer  Practice breast self-awareness. ? This means understanding how your breasts normally appear and feel. ? It also means doing regular breast self-exams. Let your health care provider know about any changes, no matter how small.  If you are 40  or older, have a clinician do a breast exam (clinical breast exam or CBE) every year. Depending on your age, family history, and medical history, it may be recommended that you also have a yearly breast X-ray (mammogram).  If you  have a family history of breast cancer, talk with your health care provider about genetic screening.  If you are at high risk for breast cancer, talk with your health care provider about having an MRI and a mammogram every year.  Breast cancer (BRCA) gene test is recommended for women who have family members with BRCA-related cancers. Results of the assessment will determine the need for genetic counseling and BRCA1 and for BRCA2 testing. BRCA-related cancers include these types: ? Breast. This occurs in males or females. ? Ovarian. ? Tubal. This may also be called fallopian tube cancer. ? Cancer of the abdominal or pelvic lining (peritoneal cancer). ? Prostate. ? Pancreatic.  Cervical, Uterine, and Ovarian Cancer Your health care provider may recommend that you be screened regularly for cancer of the pelvic organs. These include your ovaries, uterus, and vagina. This screening involves a pelvic exam, which includes checking for microscopic changes to the surface of your cervix (Pap test).  For women ages 21-65, health care providers may recommend a pelvic exam and a Pap test every three years. For women ages 79-65, they may recommend the Pap test and pelvic exam, combined with testing for human papilloma virus (HPV), every five years. Some types of HPV increase your risk of cervical cancer. Testing for HPV may also be done on women of any age who have unclear Pap test results.  Other health care providers may not recommend any screening for nonpregnant women who are considered low risk for pelvic cancer and have no symptoms. Ask your health care provider if a screening pelvic exam is right for you.  If you have had past treatment for cervical cancer or a condition that could lead to cancer, you need Pap tests and screening for cancer for at least 20 years after your treatment. If Pap tests have been discontinued for you, your risk factors (such as having a new sexual partner) need to be  reassessed to determine if you should start having screenings again. Some women have medical problems that increase the chance of getting cervical cancer. In these cases, your health care provider may recommend that you have screening and Pap tests more often.  If you have a family history of uterine cancer or ovarian cancer, talk with your health care provider about genetic screening.  If you have vaginal bleeding after reaching menopause, tell your health care provider.  There are currently no reliable tests available to screen for ovarian cancer.  Lung Cancer Lung cancer screening is recommended for adults 69-62 years old who are at high risk for lung cancer because of a history of smoking. A yearly low-dose CT scan of the lungs is recommended if you:  Currently smoke.  Have a history of at least 30 pack-years of smoking and you currently smoke or have quit within the past 15 years. A pack-year is smoking an average of one pack of cigarettes per day for one year.  Yearly screening should:  Continue until it has been 15 years since you quit.  Stop if you develop a health problem that would prevent you from having lung cancer treatment.  Colorectal Cancer  This type of cancer can be detected and can often be prevented.  Routine colorectal cancer screening usually begins at  age 42 and continues through age 45.  If you have risk factors for colon cancer, your health care provider may recommend that you be screened at an earlier age.  If you have a family history of colorectal cancer, talk with your health care provider about genetic screening.  Your health care provider may also recommend using home test kits to check for hidden blood in your stool.  A small camera at the end of a tube can be used to examine your colon directly (sigmoidoscopy or colonoscopy). This is done to check for the earliest forms of colorectal cancer.  Direct examination of the colon should be repeated every  5-10 years until age 71. However, if early forms of precancerous polyps or small growths are found or if you have a family history or genetic risk for colorectal cancer, you may need to be screened more often.  Skin Cancer  Check your skin from head to toe regularly.  Monitor any moles. Be sure to tell your health care provider: ? About any new moles or changes in moles, especially if there is a change in a mole's shape or color. ? If you have a mole that is larger than the size of a pencil eraser.  If any of your family members has a history of skin cancer, especially at a young age, talk with your health care provider about genetic screening.  Always use sunscreen. Apply sunscreen liberally and repeatedly throughout the day.  Whenever you are outside, protect yourself by wearing long sleeves, pants, a wide-brimmed hat, and sunglasses.  What should I know about osteoporosis? Osteoporosis is a condition in which bone destruction happens more quickly than new bone creation. After menopause, you may be at an increased risk for osteoporosis. To help prevent osteoporosis or the bone fractures that can happen because of osteoporosis, the following is recommended:  If you are 46-71 years old, get at least 1,000 mg of calcium and at least 600 mg of vitamin D per day.  If you are older than age 55 but younger than age 65, get at least 1,200 mg of calcium and at least 600 mg of vitamin D per day.  If you are older than age 54, get at least 1,200 mg of calcium and at least 800 mg of vitamin D per day.  Smoking and excessive alcohol intake increase the risk of osteoporosis. Eat foods that are rich in calcium and vitamin D, and do weight-bearing exercises several times each week as directed by your health care provider. What should I know about how menopause affects my mental health? Depression may occur at any age, but it is more common as you become older. Common symptoms of depression  include:  Low or sad mood.  Changes in sleep patterns.  Changes in appetite or eating patterns.  Feeling an overall lack of motivation or enjoyment of activities that you previously enjoyed.  Frequent crying spells.  Talk with your health care provider if you think that you are experiencing depression. What should I know about immunizations? It is important that you get and maintain your immunizations. These include:  Tetanus, diphtheria, and pertussis (Tdap) booster vaccine.  Influenza every year before the flu season begins.  Pneumonia vaccine.  Shingles vaccine.  Your health care provider may also recommend other immunizations. This information is not intended to replace advice given to you by your health care provider. Make sure you discuss any questions you have with your health care provider. Document Released: 06/20/2005  Document Revised: 11/16/2015 Document Reviewed: 01/30/2015 Elsevier Interactive Patient Education  2018 [employer]  

## 2017-03-24 NOTE — Assessment & Plan Note (Signed)
CMET and Lipid profile today Encouraged her to consume a low fat diet 

## 2017-03-24 NOTE — Assessment & Plan Note (Signed)
Continue Claritin and Flonase as needed  

## 2017-03-24 NOTE — Progress Notes (Signed)
HPI  Pt presents to the clinic today to establish care and for management of the conditions listed below. She is transferring care from Dr. Lacinda Axon.  Anxiety: Triggered by situational life events. She is no longer taking the Celexa. She denies depression, SI/HI.  GERD: Triggered by spicy and wine. She takes Zantac as needed with good relief.   HLD: Her last LDL was 137, 01/2016. She is not taking any cholesterol lowering medications. She does consume a low fat diet.  Hypothyroidism: She denies any issues on her current dose of Synthroid. She is due to have her levels rechecked today.  Flu: 01/2016 Tetanus: never Pneumovax: never Prevnar:never Shingles Vaccine: ? 2013 Pap Smear: total hysterectomy Mammogram: 02/2016 Bone Density: 11/2011 Colon Screening: 12/2012 Vision Screening: annually Dentist: annually  Past Medical History:  Diagnosis Date  . Anxiety   . Complication of anesthesia   . GERD (gastroesophageal reflux disease)   . Hyperlipidemia   . Hypothyroidism   . PONV (postoperative nausea and vomiting)   . Thyroid disease     Current Outpatient Medications  Medication Sig Dispense Refill  . Calcium Carb-Cholecalciferol (CALCIUM 600 + D PO) Take 1 tablet by mouth daily.    . Cholecalciferol (VITAMIN D) 2000 UNITS tablet Take 2,000 Units by mouth daily.    . citalopram (CELEXA) 10 MG tablet Take 1 tablet (10 mg total) by mouth daily. 90 tablet 3  . Cyanocobalamin (VITAMIN B 12 PO) Take 1 tablet by mouth daily.    Marland Kitchen estradiol (ESTRACE) 0.1 MG/GM vaginal cream Place 1 Applicatorful vaginally 3 (three) times a week. 42.5 g 3  . fexofenadine (ALLEGRA) 180 MG tablet Take 0.5 tablets (90 mg total) by mouth as needed for allergies or rhinitis. 90 tablet 3  . fluticasone (FLONASE) 50 MCG/ACT nasal spray Place 1 spray into both nostrils as needed for allergies or rhinitis. 16 g 1  . levothyroxine (SYNTHROID, LEVOTHROID) 75 MCG tablet TAKE ONE TABLET BY MOUTH BEFORE BREAKFAST 90 tablet  2  . Misc Natural Products (COSAMIN ASU ADVANCED FORMULA PO) Take 1 tablet by mouth daily.    . Multiple Vitamin (MULTI VITAMIN DAILY PO) Take 1 tablet by mouth daily.     No current facility-administered medications for this visit.     Allergies  Allergen Reactions  . Sulfa Antibiotics Hives  . Codeine Nausea And Vomiting  . Penicillins Hives    Family History  Problem Relation Age of Onset  . Cancer Father        Lung Cancer- smoker  . Breast cancer Neg Hx     Social History   Socioeconomic History  . Marital status: Married    Spouse name: Not on file  . Number of children: Not on file  . Years of education: Not on file  . Highest education level: Not on file  Social Needs  . Financial resource strain: Not on file  . Food insecurity - worry: Not on file  . Food insecurity - inability: Not on file  . Transportation needs - medical: Not on file  . Transportation needs - non-medical: Not on file  Occupational History  . Not on file  Tobacco Use  . Smoking status: Never Smoker  . Smokeless tobacco: Never Used  Substance and Sexual Activity  . Alcohol use: Yes    Alcohol/week: 0.6 oz    Types: 1 Glasses of wine per week  . Drug use: No  . Sexual activity: Yes    Partners: Male  Comment: Husband  Other Topics Concern  . Not on file  Social History Narrative   Lives with husband (90 years of marriage)    Children-3    No Pets   Right handed    Caffeine- 2-3 cups coffee, tea occasionally    Enjoys being with the grandchildren     ROS:  Constitutional: Denies fever, malaise, fatigue, headache or abrupt weight changes.  HEENT: Denies eye pain, eye redness, ear pain, ringing in the ears, wax buildup, runny nose, nasal congestion, bloody nose, or sore throat. Respiratory: Denies difficulty breathing, shortness of breath, cough or sputum production.   Cardiovascular: Denies chest pain, chest tightness, palpitations or swelling in the hands or feet.   Gastrointestinal: Denies abdominal pain, bloating, constipation, diarrhea or blood in the stool.  GU: Denies frequency, urgency, pain with urination, blood in urine, odor or discharge. Musculoskeletal: Pt reports intermittent joint aches. Denies decrease in range of motion, difficulty with gait, muscle pain or joint swelling.  Skin: Denies redness, rashes, lesions or ulcercations.  Neurological: Denies dizziness, difficulty with memory, difficulty with speech or problems with balance and coordination.  Psych: Denies anxiety, depression, SI/HI.  No other specific complaints in a complete review of systems (except as listed in HPI above).  PE:  BP 122/78   Pulse 66   Temp 97.6 F (36.4 C) (Oral)   Wt 194 lb (88 kg)   SpO2 97%   BMI 29.94 kg/m   Wt Readings from Last 3 Encounters:  07/30/16 192 lb (87.1 kg)  02/01/16 188 lb 4 oz (85.4 kg)  10/04/15 187 lb 4 oz (84.9 kg)    General: Appears her stated age, obese in NAD. Cardiovascular: Normal rate and rhythm. S1,S2 noted.  No murmur, rubs or gallops noted. No JVD or BLE edema. No carotid bruits noted. Pulmonary/Chest: Normal effort and positive vesicular breath sounds. No respiratory distress. No wheezes, rales or ronchi noted.   Neurological: Alert and oriented.  Psychiatric: Mood and affect normal. Behavior is normal. Judgment and thought content normal.    BMET    Component Value Date/Time   NA 140 02/01/2016 0932   NA 140 12/05/2013   K 4.1 02/01/2016 0932   CL 103 02/01/2016 0932   CO2 29 02/01/2016 0932   GLUCOSE 93 02/01/2016 0932   BUN 19 02/01/2016 0932   BUN 16 12/05/2013   CREATININE 0.99 02/01/2016 0932   CALCIUM 9.6 02/01/2016 0932    Lipid Panel     Component Value Date/Time   CHOL 231 (H) 02/01/2016 0932   TRIG 98.0 02/01/2016 0932   HDL 76.30 02/01/2016 0932   CHOLHDL 3 02/01/2016 0932   VLDL 19.6 02/01/2016 0932   LDLCALC 135 (H) 02/01/2016 0932    CBC    Component Value Date/Time   WBC 5.1  02/01/2016 0932   RBC 4.81 02/01/2016 0932   HGB 14.8 02/01/2016 0932   HCT 43.0 02/01/2016 0932   PLT 248.0 02/01/2016 0932   MCV 89.4 02/01/2016 0932   MCHC 34.4 02/01/2016 0932   RDW 13.4 02/01/2016 0932   LYMPHSABS 1.7 12/20/2014 1700   MONOABS 0.4 12/20/2014 1700   EOSABS 0.2 12/20/2014 1700   BASOSABS 0.0 12/20/2014 1700    Hgb A1C Lab Results  Component Value Date   HGBA1C 5.7 02/01/2016     Assessment and Plan:

## 2017-03-24 NOTE — Assessment & Plan Note (Signed)
Controlled off meds Support offered today Will monitor

## 2017-03-24 NOTE — Addendum Note (Signed)
Addended by: Lurlean Nanny on: 03/24/2017 04:55 PM   Modules accepted: Orders

## 2017-03-25 LAB — TSH: TSH: 1.34 u[IU]/mL (ref 0.35–4.50)

## 2017-03-25 LAB — CBC
HCT: 40.5 % (ref 36.0–46.0)
HEMOGLOBIN: 13.3 g/dL (ref 12.0–15.0)
MCHC: 32.7 g/dL (ref 30.0–36.0)
MCV: 92.8 fl (ref 78.0–100.0)
Platelets: 232 10*3/uL (ref 150.0–400.0)
RBC: 4.37 Mil/uL (ref 3.87–5.11)
RDW: 13.2 % (ref 11.5–15.5)
WBC: 5.7 10*3/uL (ref 4.0–10.5)

## 2017-03-25 LAB — COMPREHENSIVE METABOLIC PANEL
ALK PHOS: 41 U/L (ref 39–117)
ALT: 18 U/L (ref 0–35)
AST: 21 U/L (ref 0–37)
Albumin: 4.2 g/dL (ref 3.5–5.2)
BILIRUBIN TOTAL: 0.6 mg/dL (ref 0.2–1.2)
BUN: 16 mg/dL (ref 6–23)
CO2: 27 mEq/L (ref 19–32)
CREATININE: 1 mg/dL (ref 0.40–1.20)
Calcium: 9.8 mg/dL (ref 8.4–10.5)
Chloride: 103 mEq/L (ref 96–112)
GFR: 58.94 mL/min — AB (ref >60.00)
GLUCOSE: 94 mg/dL (ref 70–99)
Potassium: 4.5 mEq/L (ref 3.5–5.1)
SODIUM: 139 meq/L (ref 135–145)
TOTAL PROTEIN: 6.7 g/dL (ref 6.0–8.3)

## 2017-03-25 LAB — LIPID PANEL
CHOL/HDL RATIO: 3
Cholesterol: 238 mg/dL — ABNORMAL HIGH (ref 0–200)
HDL: 72.7 mg/dL (ref >39.00)
LDL CALC: 148 mg/dL — AB (ref 0–99)
NONHDL: 165.39
Triglycerides: 85 mg/dL (ref 0.0–149.0)
VLDL: 17 mg/dL (ref 0.0–40.0)

## 2017-03-25 LAB — T4, FREE: Free T4: 1.05 ng/dL (ref 0.60–1.60)

## 2017-04-29 ENCOUNTER — Encounter: Payer: Managed Care, Other (non HMO) | Admitting: Family Medicine

## 2017-05-26 ENCOUNTER — Other Ambulatory Visit: Payer: Self-pay | Admitting: Internal Medicine

## 2017-05-26 DIAGNOSIS — Z1231 Encounter for screening mammogram for malignant neoplasm of breast: Secondary | ICD-10-CM

## 2017-06-29 ENCOUNTER — Ambulatory Visit
Admission: RE | Admit: 2017-06-29 | Discharge: 2017-06-29 | Disposition: A | Discharge status: Home / Self Care | Payer: Medicare Other | Source: Ambulatory Visit | Attending: Internal Medicine | Admitting: Internal Medicine

## 2017-06-29 DIAGNOSIS — Z1231 Encounter for screening mammogram for malignant neoplasm of breast: Secondary | ICD-10-CM | POA: Insufficient documentation

## 2017-07-07 DIAGNOSIS — L858 Other specified epidermal thickening: Secondary | ICD-10-CM | POA: Diagnosis not present

## 2017-07-07 DIAGNOSIS — L821 Other seborrheic keratosis: Secondary | ICD-10-CM | POA: Diagnosis not present

## 2017-07-07 DIAGNOSIS — L578 Other skin changes due to chronic exposure to nonionizing radiation: Secondary | ICD-10-CM | POA: Diagnosis not present

## 2017-07-07 DIAGNOSIS — L814 Other melanin hyperpigmentation: Secondary | ICD-10-CM | POA: Diagnosis not present

## 2017-07-07 DIAGNOSIS — L438 Other lichen planus: Secondary | ICD-10-CM | POA: Diagnosis not present

## 2017-07-07 DIAGNOSIS — L820 Inflamed seborrheic keratosis: Secondary | ICD-10-CM | POA: Diagnosis not present

## 2017-07-14 DIAGNOSIS — M9901 Segmental and somatic dysfunction of cervical region: Secondary | ICD-10-CM | POA: Diagnosis not present

## 2017-07-14 DIAGNOSIS — M436 Torticollis: Secondary | ICD-10-CM | POA: Diagnosis not present

## 2017-08-20 DIAGNOSIS — H35371 Puckering of macula, right eye: Secondary | ICD-10-CM | POA: Diagnosis not present

## 2017-09-03 DIAGNOSIS — H35371 Puckering of macula, right eye: Secondary | ICD-10-CM | POA: Diagnosis not present

## 2017-09-07 DIAGNOSIS — L928 Other granulomatous disorders of the skin and subcutaneous tissue: Secondary | ICD-10-CM | POA: Diagnosis not present

## 2017-09-07 DIAGNOSIS — L905 Scar conditions and fibrosis of skin: Secondary | ICD-10-CM | POA: Diagnosis not present

## 2017-09-17 DIAGNOSIS — H35371 Puckering of macula, right eye: Secondary | ICD-10-CM | POA: Diagnosis not present

## 2017-09-17 DIAGNOSIS — H26491 Other secondary cataract, right eye: Secondary | ICD-10-CM | POA: Diagnosis not present

## 2017-10-08 ENCOUNTER — Ambulatory Visit: Payer: Self-pay

## 2017-10-08 ENCOUNTER — Encounter: Payer: Self-pay | Admitting: Internal Medicine

## 2017-10-08 ENCOUNTER — Ambulatory Visit (INDEPENDENT_AMBULATORY_CARE_PROVIDER_SITE_OTHER): Payer: Medicare Other | Admitting: Internal Medicine

## 2017-10-08 VITALS — BP 122/78 | HR 91 | Temp 98.1°F | Wt 187.0 lb

## 2017-10-08 DIAGNOSIS — R1030 Lower abdominal pain, unspecified: Secondary | ICD-10-CM

## 2017-10-08 DIAGNOSIS — R195 Other fecal abnormalities: Secondary | ICD-10-CM

## 2017-10-08 MED ORDER — METRONIDAZOLE 500 MG PO TABS: 500.0000 mg | ORAL_TABLET | Freq: Three times a day (TID) | ORAL | 0 refills | Status: DC

## 2017-10-08 MED ORDER — CIPROFLOXACIN HCL 500 MG PO TABS: 500.0000 mg | ORAL_TABLET | Freq: Two times a day (BID) | ORAL | 0 refills | Status: DC

## 2017-10-08 NOTE — Progress Notes (Signed)
Subjective:    Patient ID: Sarah Jacobson, female    DOB: 01/06/1951, 67 y.o.   MRN: [id number]  HPI  Pt presents to the clinic today with c/o lower abdominal pain and loose stools. This started 1 week ago. She describes the pain as cramping. She is feeling a lot of pressure in her rectum. She reports the loose stools occurs shortly after eating. The cramping in relieved by having a BM. She denies blood in her stool. She denies fever, chills or body aches. She denies recent changes in medications. She has been eating more fresh fruits and vegetables. She has not tried anything OTC for her symptoms. She had a colonoscopy in the past which she reports did show some diverticulosis.  Review of Systems  Past Medical History:  Diagnosis Date  . Anxiety   . Complication of anesthesia   . GERD (gastroesophageal reflux disease)   . Hyperlipidemia   . Hypothyroidism   . PONV (postoperative nausea and vomiting)   . Thyroid disease     Current Outpatient Medications  Medication Sig Dispense Refill  . Calcium Carb-Cholecalciferol (CALCIUM 600 + D PO) Take 1 tablet by mouth daily.    . Cholecalciferol (VITAMIN D) 2000 UNITS tablet Take 2,000 Units by mouth daily.    . Cyanocobalamin (VITAMIN B 12 PO) Take 1 tablet by mouth daily.    . fexofenadine (ALLEGRA) 180 MG tablet Take 0.5 tablets (90 mg total) by mouth as needed for allergies or rhinitis. 90 tablet 3  . fluticasone (FLONASE) 50 MCG/ACT nasal spray Place 1 spray into both nostrils as needed for allergies or rhinitis. 16 g 1  . levothyroxine (SYNTHROID, LEVOTHROID) 75 MCG tablet TAKE ONE TABLET BY MOUTH BEFORE BREAKFAST 90 tablet 2  . Misc Natural Products (COSAMIN ASU ADVANCED FORMULA PO) Take 1 tablet by mouth daily.    . Multiple Vitamin (MULTI VITAMIN DAILY PO) Take 1 tablet by mouth daily.    . Probiotic Product (PROBIOTIC DAILY) CAPS Take 1 capsule by mouth daily.     No current facility-administered medications for this visit.      Allergies  Allergen Reactions  . Sulfa Antibiotics Hives  . Codeine Nausea And Vomiting  . Penicillins Hives    Family History  Problem Relation Age of Onset  . Cancer Father        Lung Cancer- smoker  . Breast cancer Neg Hx     Social History   Socioeconomic History  . Marital status: Married    Spouse name: Not on file  . Number of children: Not on file  . Years of education: Not on file  . Highest education level: Not on file  Occupational History  . Not on file  Social Needs  . Financial resource strain: Not on file  . Food insecurity:    Worry: Not on file    Inability: Not on file  . Transportation needs:    Medical: Not on file    Non-medical: Not on file  Tobacco Use  . Smoking status: Never Smoker  . Smokeless tobacco: Never Used  Substance and Sexual Activity  . Alcohol use: Yes    Alcohol/week: 0.6 oz    Types: 1 Glasses of wine per week  . Drug use: No  . Sexual activity: Yes    Partners: Male    Comment: Husband  Lifestyle  . Physical activity:    Days per week: Not on file    Minutes per session: Not on  file  . Stress: Not on file  Relationships  . Social connections:    Talks on phone: Not on file    Gets together: Not on file    Attends religious service: Not on file    Active member of club or organization: Not on file    Attends meetings of clubs or organizations: Not on file    Relationship status: Not on file  . Intimate partner violence:    Fear of current or ex partner: Not on file    Emotionally abused: Not on file    Physically abused: Not on file    Forced sexual activity: Not on file  Other Topics Concern  . Not on file  Social History Narrative   Lives with husband (16 years of marriage)    Children-3    No Pets   Right handed    Caffeine- 2-3 cups coffee, tea occasionally    Enjoys being with the grandchildren      Constitutional: Denies fever, malaise, fatigue, headache or abrupt weight changes.   Respiratory: Denies difficulty breathing, shortness of breath, cough or sputum production.   Cardiovascular: Denies chest pain, chest tightness, palpitations or swelling in the hands or feet.  Gastrointestinal: Pt reports abdominal pain and loose stools.  Denies bloating, constipation, or blood in the stool.  GU: Denies urgency, frequency, pain with urination, burning sensation, blood in urine, odor or discharge.  No other specific complaints in a complete review of systems (except as listed in HPI above).     Objective:   Physical Exam  BP 122/78   Pulse 91   Temp 98.1 F (36.7 C) (Oral)   Wt 187 lb (84.8 kg)   SpO2 97%   BMI 28.86 kg/m  Wt Readings from Last 3 Encounters:  10/08/17 187 lb (84.8 kg)  03/24/17 194 lb (88 kg)  07/30/16 192 lb (87.1 kg)    General: Appears her stated age, well developed, well nourished in NAD. Abdomen: Soft and tender in the lower abdomen L>R. Normal bowel sounds. No distention or masses noted. No CVA tenderness noted.   BMET    Component Value Date/Time   NA 139 03/24/2017 1601   NA 140 12/05/2013   K 4.5 03/24/2017 1601   CL 103 03/24/2017 1601   CO2 27 03/24/2017 1601   GLUCOSE 94 03/24/2017 1601   BUN 16 03/24/2017 1601   BUN 16 12/05/2013   CREATININE 1.00 03/24/2017 1601   CALCIUM 9.8 03/24/2017 1601    Lipid Panel     Component Value Date/Time   CHOL 238 (H) 03/24/2017 1601   TRIG 85.0 03/24/2017 1601   HDL 72.70 03/24/2017 1601   CHOLHDL 3 03/24/2017 1601   VLDL 17.0 03/24/2017 1601   LDLCALC 148 (H) 03/24/2017 1601    CBC    Component Value Date/Time   WBC 5.7 03/24/2017 1601   RBC 4.37 03/24/2017 1601   HGB 13.3 03/24/2017 1601   HCT 40.5 03/24/2017 1601   PLT 232.0 03/24/2017 1601   MCV 92.8 03/24/2017 1601   MCHC 32.7 03/24/2017 1601   RDW 13.2 03/24/2017 1601   LYMPHSABS 1.7 12/20/2014 1700   MONOABS 0.4 12/20/2014 1700   EOSABS 0.2 12/20/2014 1700   BASOSABS 0.0 12/20/2014 1700    Hgb A1C Lab  Results  Component Value Date   HGBA1C 5.7 02/01/2016            Assessment & Plan:   Abdominal Pain, Diarrhea:  Possible diverticulitis Discussed CT  scan abd vs treatment with medication Will treat with Cipro and Flaygl x 7 days Discussed clear liquid diet, advance as tolerated Discussed foods to avoid that trigger diverticulitis If no improvement, will get CT scan of abdomen  Return precautions discussed Webb Silversmith, NP

## 2017-10-08 NOTE — Patient Instructions (Signed)

## 2017-10-08 NOTE — Telephone Encounter (Signed)
Pt. C/o abdominal pain this week and is "getting worse". Lower abdomen over pubic bone. Having "some loose stools." Feels" like a balloon is blown up in my belly." Denies fever, nausea, vomiting. Appointment made for today.  Reason for Disposition . [1] MODERATE pain (e.g., interferes with normal activities) AND [2] pain comes and goes (cramps) AND [3] present > 24 hours  (Exception: pain with Vomiting or Diarrhea - see that Guideline)  Answer Assessment - Initial Assessment Questions 1. LOCATION: "Where does it hurt?"      Lower abdomen - above pubic bone and all across 2. RADIATION: "Does the pain shoot anywhere else?" (e.g., chest, back)      Low back 3. ONSET: "When did the pain begin?" (e.g., minutes, hours or days ago)      Started this week 4. SUDDEN: "Gradual or sudden onset?"      Gradual and getting worse 5. PATTERN "Does the pain come and go, or is it constant?"    - If constant: "Is it getting better, staying the same, or worsening?"      (Note: Constant means the pain never goes away completely; most serious pain is constant and it progresses)     - If intermittent: "How long does it last?" "Do you have pain now?"     (Note: Intermittent means the pain goes away completely between bouts)     Constant 6. SEVERITY: "How bad is the pain?"  (e.g., Scale 1-10; mild, moderate, or severe)   - MILD (1-3): doesn't interfere with normal activities, abdomen soft and not tender to touch    - MODERATE (4-7): interferes with normal activities or awakens from sleep, tender to touch    - SEVERE (8-10): excruciating pain, doubled over, unable to do any normal activities      7 7. RECURRENT SYMPTOM: "Have you ever had this type of abdominal pain before?" If so, ask: "When was the last time?" and "What happened that time?"      No 8. CAUSE: "What do you think is causing the abdominal pain?"     Unsure 9. RELIEVING/AGGRAVATING FACTORS: "What makes it better or worse?" (e.g., movement, antacids,  bowel movement)     Feels better after BM 10. OTHER SYMPTOMS: "Has there been any vomiting, diarrhea, constipation, or urine problems?"       Loose stools 11. PREGNANCY: "Is there any chance you are pregnant?" "When was your last menstrual period?"       No  Protocols used: ABDOMINAL PAIN - [facility]

## 2017-10-09 ENCOUNTER — Encounter: Payer: Self-pay | Admitting: Internal Medicine

## 2017-10-12 ENCOUNTER — Other Ambulatory Visit: Payer: Self-pay | Admitting: Family Medicine

## 2017-10-19 ENCOUNTER — Other Ambulatory Visit: Payer: Self-pay | Admitting: Internal Medicine

## 2017-10-19 ENCOUNTER — Encounter: Payer: Self-pay | Admitting: Internal Medicine

## 2017-10-19 MED ORDER — HYDROCORTISONE 2.5 % RE CREA
1.0000 | TOPICAL_CREAM | Freq: Two times a day (BID) | RECTAL | 0 refills | Status: DC
Start: 2017-10-19 — End: 2018-04-29

## 2017-10-19 NOTE — Progress Notes (Signed)
poctosol

## 2017-10-20 MED ORDER — HYDROCORTISONE ACETATE 25 MG RE SUPP: 25.0000 mg | Freq: Two times a day (BID) | RECTAL | 0 refills | Status: DC

## 2017-10-21 DIAGNOSIS — K645 Perianal venous thrombosis: Secondary | ICD-10-CM | POA: Diagnosis not present

## 2017-10-21 DIAGNOSIS — R195 Other fecal abnormalities: Secondary | ICD-10-CM | POA: Diagnosis not present

## 2017-11-18 DIAGNOSIS — K645 Perianal venous thrombosis: Secondary | ICD-10-CM | POA: Diagnosis not present

## 2017-11-18 DIAGNOSIS — K648 Other hemorrhoids: Secondary | ICD-10-CM | POA: Diagnosis not present

## 2017-12-16 DIAGNOSIS — K648 Other hemorrhoids: Secondary | ICD-10-CM | POA: Diagnosis not present

## 2017-12-21 DIAGNOSIS — L814 Other melanin hyperpigmentation: Secondary | ICD-10-CM | POA: Diagnosis not present

## 2017-12-21 DIAGNOSIS — Z85828 Personal history of other malignant neoplasm of skin: Secondary | ICD-10-CM | POA: Diagnosis not present

## 2017-12-21 DIAGNOSIS — D229 Melanocytic nevi, unspecified: Secondary | ICD-10-CM | POA: Diagnosis not present

## 2017-12-21 DIAGNOSIS — L821 Other seborrheic keratosis: Secondary | ICD-10-CM | POA: Diagnosis not present

## 2017-12-30 DIAGNOSIS — K648 Other hemorrhoids: Secondary | ICD-10-CM | POA: Diagnosis not present

## 2018-01-26 DIAGNOSIS — K5730 Diverticulosis of large intestine without perforation or abscess without bleeding: Secondary | ICD-10-CM | POA: Diagnosis not present

## 2018-01-26 DIAGNOSIS — Z86010 Personal history of colonic polyps: Secondary | ICD-10-CM | POA: Diagnosis not present

## 2018-01-26 DIAGNOSIS — Z1211 Encounter for screening for malignant neoplasm of colon: Secondary | ICD-10-CM | POA: Diagnosis not present

## 2018-03-04 ENCOUNTER — Encounter: Payer: Self-pay | Admitting: Internal Medicine

## 2018-03-11 ENCOUNTER — Ambulatory Visit (INDEPENDENT_AMBULATORY_CARE_PROVIDER_SITE_OTHER): Payer: Medicare Other

## 2018-03-11 DIAGNOSIS — Z23 Encounter for immunization: Secondary | ICD-10-CM

## 2018-03-30 DIAGNOSIS — M436 Torticollis: Secondary | ICD-10-CM | POA: Diagnosis not present

## 2018-03-30 DIAGNOSIS — M9901 Segmental and somatic dysfunction of cervical region: Secondary | ICD-10-CM | POA: Diagnosis not present

## 2018-04-05 DIAGNOSIS — M9901 Segmental and somatic dysfunction of cervical region: Secondary | ICD-10-CM | POA: Diagnosis not present

## 2018-04-05 DIAGNOSIS — M436 Torticollis: Secondary | ICD-10-CM | POA: Diagnosis not present

## 2018-04-22 ENCOUNTER — Other Ambulatory Visit: Payer: Self-pay | Admitting: Internal Medicine

## 2018-04-29 ENCOUNTER — Ambulatory Visit (INDEPENDENT_AMBULATORY_CARE_PROVIDER_SITE_OTHER): Payer: Medicare Other | Admitting: Internal Medicine

## 2018-04-29 ENCOUNTER — Encounter: Payer: Self-pay | Admitting: Internal Medicine

## 2018-04-29 VITALS — BP 122/76 | HR 71 | Temp 98.1°F | Ht 67.5 in | Wt 194.0 lb

## 2018-04-29 DIAGNOSIS — E7800 Pure hypercholesterolemia, unspecified: Secondary | ICD-10-CM | POA: Diagnosis not present

## 2018-04-29 DIAGNOSIS — Z780 Asymptomatic menopausal state: Secondary | ICD-10-CM | POA: Diagnosis not present

## 2018-04-29 DIAGNOSIS — E039 Hypothyroidism, unspecified: Secondary | ICD-10-CM | POA: Diagnosis not present

## 2018-04-29 DIAGNOSIS — Z23 Encounter for immunization: Secondary | ICD-10-CM | POA: Diagnosis not present

## 2018-04-29 DIAGNOSIS — E559 Vitamin D deficiency, unspecified: Secondary | ICD-10-CM

## 2018-04-29 DIAGNOSIS — Z0000 Encounter for general adult medical examination without abnormal findings: Secondary | ICD-10-CM | POA: Diagnosis not present

## 2018-04-29 DIAGNOSIS — F411 Generalized anxiety disorder: Secondary | ICD-10-CM | POA: Diagnosis not present

## 2018-04-29 DIAGNOSIS — Z1159 Encounter for screening for other viral diseases: Secondary | ICD-10-CM | POA: Diagnosis not present

## 2018-04-29 LAB — COMPREHENSIVE METABOLIC PANEL
ALT: 17 U/L (ref 0–35)
AST: 16 U/L (ref 0–37)
Albumin: 4.1 g/dL (ref 3.5–5.2)
Alkaline Phosphatase: 49 U/L (ref 39–117)
BILIRUBIN TOTAL: 0.4 mg/dL (ref 0.2–1.2)
BUN: 19 mg/dL (ref 6–23)
CO2: 28 meq/L (ref 19–32)
CREATININE: 0.86 mg/dL (ref 0.40–1.20)
Calcium: 9.8 mg/dL (ref 8.4–10.5)
Chloride: 105 mEq/L (ref 96–112)
GFR: 69.91 mL/min (ref >60.00)
GLUCOSE: 88 mg/dL (ref 70–99)
Potassium: 4.6 mEq/L (ref 3.5–5.1)
Sodium: 140 mEq/L (ref 135–145)
Total Protein: 6.8 g/dL (ref 6.0–8.3)

## 2018-04-29 LAB — LIPID PANEL
CHOLESTEROL: 237 mg/dL — AB (ref 0–200)
HDL: 77.5 mg/dL (ref >39.00)
LDL CALC: 134 mg/dL — AB (ref 0–99)
NonHDL: 159.73
Total CHOL/HDL Ratio: 3
Triglycerides: 127 mg/dL (ref 0.0–149.0)
VLDL: 25.4 mg/dL (ref 0.0–40.0)

## 2018-04-29 LAB — TSH: TSH: 1.28 u[IU]/mL (ref 0.35–4.50)

## 2018-04-29 LAB — CBC
HCT: 41.3 % (ref 36.0–46.0)
Hemoglobin: 13.8 g/dL (ref 12.0–15.0)
MCHC: 33.3 g/dL (ref 30.0–36.0)
MCV: 90.2 fl (ref 78.0–100.0)
Platelets: 226 10*3/uL (ref 150.0–400.0)
RBC: 4.58 Mil/uL (ref 3.87–5.11)
RDW: 13.5 % (ref 11.5–15.5)
WBC: 5.5 10*3/uL (ref 4.0–10.5)

## 2018-04-29 LAB — T4, FREE: Free T4: 0.91 ng/dL (ref 0.60–1.60)

## 2018-04-29 LAB — VITAMIN D 25 HYDROXY (VIT D DEFICIENCY, FRACTURES): VITD: 51.29 ng/mL (ref 30.00–100.00)

## 2018-04-29 NOTE — Progress Notes (Addendum)
HPI:  Pt presents to the clinic today for her Medicare Wellness Exam. She is also due to follow up chronic conditions.  Anxiety: Triggered by situational stress. She is currently not taking any antianxiety medications. She denies SI/HI.  GERD: Triggered by spicy foods and alcohol. This is occurring about 3 days per week. She takes Omeprazole as needed with good relief. There is no upper GI on file.  HLD: Her last LDL was 148, 03/2017. She is not taking any cholesterol lowering medications at this time. She tries to consume a low fat diet.   Hypothyroidism: She denies any issues on her current dose of Levothyroxine.  Past Medical History:  Diagnosis Date  . Anxiety   . Complication of anesthesia   . GERD (gastroesophageal reflux disease)   . Hyperlipidemia   . Hypothyroidism   . PONV (postoperative nausea and vomiting)   . Thyroid disease     Current Outpatient Medications  Medication Sig Dispense Refill  . Calcium Carb-Cholecalciferol (CALCIUM 600 + D PO) Take 1 tablet by mouth daily.    . Cholecalciferol (VITAMIN D) 2000 UNITS tablet Take 2,000 Units by mouth daily.    . ciprofloxacin (CIPRO) 500 MG tablet Take 1 tablet (500 mg total) by mouth 2 (two) times daily. 14 tablet 0  . Cyanocobalamin (VITAMIN B 12 PO) Take 1 tablet by mouth daily.    . fexofenadine (ALLEGRA) 180 MG tablet Take 0.5 tablets (90 mg total) by mouth as needed for allergies or rhinitis. 90 tablet 3  . fluticasone (FLONASE) 50 MCG/ACT nasal spray Place 1 spray into both nostrils as needed for allergies or rhinitis. 16 g 1  . hydrocortisone (ANUSOL-HC) 2.5 % rectal cream Place 1 application rectally 2 (two) times daily. 30 g 0  . hydrocortisone (ANUSOL-HC) 25 MG suppository Place 1 suppository (25 mg total) rectally 2 (two) times daily. 12 suppository 0  . levothyroxine (SYNTHROID, LEVOTHROID) 75 MCG tablet TAKE ONE TABLET BY MOUTH BEFORE BREAKFAST 90 tablet 0  . metroNIDAZOLE (FLAGYL) 500 MG tablet Take 1 tablet  (500 mg total) by mouth 3 (three) times daily. 21 tablet 0  . Misc Natural Products (COSAMIN ASU ADVANCED FORMULA PO) Take 1 tablet by mouth daily.    . Multiple Vitamin (MULTI VITAMIN DAILY PO) Take 1 tablet by mouth daily.    . Probiotic Product (PROBIOTIC DAILY) CAPS Take 1 capsule by mouth daily.     No current facility-administered medications for this visit.     Allergies  Allergen Reactions  . Sulfa Antibiotics Hives  . Codeine Nausea And Vomiting  . Penicillins Hives    Family History  Problem Relation Age of Onset  . Cancer Father        Lung Cancer- smoker  . Breast cancer Neg Hx     Social History   Socioeconomic History  . Marital status: Married    Spouse name: Not on file  . Number of children: Not on file  . Years of education: Not on file  . Highest education level: Not on file  Occupational History  . Not on file  Social Needs  . Financial resource strain: Not on file  . Food insecurity:    Worry: Not on file    Inability: Not on file  . Transportation needs:    Medical: Not on file    Non-medical: Not on file  Tobacco Use  . Smoking status: Never Smoker  . Smokeless tobacco: Never Used  Substance and Sexual Activity  .  Alcohol use: Yes    Alcohol/week: 1.0 standard drinks    Types: 1 Glasses of wine per week  . Drug use: No  . Sexual activity: Yes    Partners: Male    Comment: Husband  Lifestyle  . Physical activity:    Days per week: Not on file    Minutes per session: Not on file  . Stress: Not on file  Relationships  . Social connections:    Talks on phone: Not on file    Gets together: Not on file    Attends religious service: Not on file    Active member of club or organization: Not on file    Attends meetings of clubs or organizations: Not on file    Relationship status: Not on file  . Intimate partner violence:    Fear of current or ex partner: Not on file    Emotionally abused: Not on file    Physically abused: Not on file     Forced sexual activity: Not on file  Other Topics Concern  . Not on file  Social History Narrative   Lives with husband (31 years of marriage)    Children-3    No Pets   Right handed    Caffeine- 2-3 cups coffee, tea occasionally    Enjoys being with the grandchildren     Hospitiliaztions: None  Health Maintenance:    Flu: 03/2018  Tetanus: 2010  Pneumovax: never  Prevnar: 03/2017  Zostavax: ?2013  Shingrix: never  Mammogram: 06/2017  Pap Smear: Total Hysterectomy  Bone Density: 11/2011  Colon Screening: 12/2012  Eye Doctor: annually  Dental Exam: annually   Providers:   PCP: Webb Silversmith, NP-C  Dermatologist: Dr. Yvette Rack  Ophthalmology: Dr. Thomasene Ripple  Gastroenterologist: Dr. Earlean Shawl    I have personally reviewed and have noted:  1. The patient's medical and social history 2. Their use of alcohol, tobacco or illicit drugs 3. Their current medications and supplements 4. The patient's functional ability including ADL's, fall risks, home safety risks and hearing or visual impairment. 5. Diet and physical activities 6. Evidence for depression or mood disorder  Subjective:   Review of Systems:   Constitutional: Denies fever, malaise, fatigue, headache or abrupt weight changes.  HEENT: Denies eye pain, eye redness, ear pain, ringing in the ears, wax buildup, runny nose, nasal congestion, bloody nose, or sore throat. Respiratory: Denies difficulty breathing, shortness of breath, cough or sputum production.   Cardiovascular: Denies chest pain, chest tightness, palpitations or swelling in the hands or feet.  Gastrointestinal: Pt reports reflux. Denies abdominal pain, bloating, constipation, diarrhea or blood in the stool.  GU: Denies urgency, frequency, pain with urination, burning sensation, blood in urine, odor or discharge. Musculoskeletal: Denies decrease in range of motion, difficulty with gait, muscle pain or joint pain and swelling.  Skin: Denies redness,  rashes, lesions or ulcercations.  Neurological: Denies dizziness, difficulty with memory, difficulty with speech or problems with balance and coordination.  Psych: Pt has a history of anxiety. Denies depression, SI/HI.  No other specific complaints in a complete review of systems (except as listed in HPI above).  Objective:  PE:   BP 122/76   Pulse 71   Temp 98.1 F (36.7 C) (Oral)   Ht 5' 7.5" (1.715 m)   Wt 194 lb (88 kg)   SpO2 97%   BMI 29.94 kg/m   Wt Readings from Last 3 Encounters:  10/08/17 187 lb (84.8 kg)  03/24/17 194 lb (88 kg)  07/30/16 192 lb (87.1 kg)    General: Appears her stated age, well developed, well nourished in NAD. Skin: Warm, dry and intact.  HEENT: Head: normal shape and size; Eyes: sclera white, no icterus, conjunctiva pink, PERRLA and EOMs intact; Ears: Tm's gray and intact, normal light reflex; Throat/Mouth: Teeth present, mucosa pink and moist, no exudate, lesions or ulcerations noted.  Neck: Neck supple, trachea midline. No masses, lumps or thyromegaly present.  Cardiovascular: Normal rate and rhythm. S1,S2 noted.  No murmur, rubs or gallops noted. No JVD or BLE edema. No carotid bruits noted. Pulmonary/Chest: Normal effort and positive vesicular breath sounds. No respiratory distress. No wheezes, rales or ronchi noted.  Abdomen: Soft and nontender. Normal bowel sounds. No distention or masses noted. Liver, spleen and kidneys non palpable. Musculoskeletal:  Strength 5/5 BUE/BLE. No signs of joint swelling.  Neurological: Alert and oriented. Cranial nerves II-XII grossly intact. Coordination normal.  Psychiatric: Mood and affect normal. Behavior is normal. Judgment and thought content normal.     BMET    Component Value Date/Time   NA 139 03/24/2017 1601   NA 140 12/05/2013   K 4.5 03/24/2017 1601   CL 103 03/24/2017 1601   CO2 27 03/24/2017 1601   GLUCOSE 94 03/24/2017 1601   BUN 16 03/24/2017 1601   BUN 16 12/05/2013   CREATININE 1.00  03/24/2017 1601   CALCIUM 9.8 03/24/2017 1601    Lipid Panel     Component Value Date/Time   CHOL 238 (H) 03/24/2017 1601   TRIG 85.0 03/24/2017 1601   HDL 72.70 03/24/2017 1601   CHOLHDL 3 03/24/2017 1601   VLDL 17.0 03/24/2017 1601   LDLCALC 148 (H) 03/24/2017 1601    CBC    Component Value Date/Time   WBC 5.7 03/24/2017 1601   RBC 4.37 03/24/2017 1601   HGB 13.3 03/24/2017 1601   HCT 40.5 03/24/2017 1601   PLT 232.0 03/24/2017 1601   MCV 92.8 03/24/2017 1601   MCHC 32.7 03/24/2017 1601   RDW 13.2 03/24/2017 1601   LYMPHSABS 1.7 12/20/2014 1700   MONOABS 0.4 12/20/2014 1700   EOSABS 0.2 12/20/2014 1700   BASOSABS 0.0 12/20/2014 1700    Hgb A1C Lab Results  Component Value Date   HGBA1C 5.7 02/01/2016      Assessment and Plan:   Medicare Annual Wellness Visit:  Diet: She does eat meat. She consumes fruits and veggies daily. She tries to avoid fried foods. She drinks some coffee, mostly water. Physical activity: None Depression/mood screen: Negative Hearing: Intact to whispered voice Visual acuity: Grossly normal, performs annual eye exam  ADLs: Capable Fall risk: None Home safety: Good Cognitive evaluation: Intact to orientation, naming, recall and repetition EOL planning: No adv directives, full code/ I agree  Preventative Medicine: Flu shot UTD. She declines tetanus for financial reasons. Prevnar UTD. Will get pneumovax in 1 year. Shingles vaccines UTD per her report. Mammogram UTD. She no longer needs pap smears. Bone density ordered. Colon screening UTD. Encouraged her to consume a balanced diet and exercise regimen. Advised her to see an eye doctor and dentist annually. Will check CBC< CMET, Lipid, Vit D, TSH, Free T4 and Hep C today.   Next appointment: 1 year, Medicare Wellness Exam   Webb Silversmith, NP

## 2018-04-29 NOTE — Assessment & Plan Note (Signed)
CMET and Lipid profile today Encouraged her to consume a low fat diet Consider statin therapy if LDL elevated

## 2018-04-29 NOTE — Assessment & Plan Note (Signed)
TSH and Free T4 today Will adjust Levothyroxine if needed based on labs 

## 2018-04-29 NOTE — Patient Instructions (Signed)
Health Maintenance for Postmenopausal Women Menopause is a normal process in which your reproductive ability comes to an end. This process happens gradually over a span of months to years, usually between the ages of 62 and 89. Menopause is complete when you have missed 12 consecutive menstrual periods. It is important to talk with your health care provider about some of the most common conditions that affect postmenopausal women, such as heart disease, cancer, and bone loss (osteoporosis). Adopting a healthy lifestyle and getting preventive care can help to promote your health and wellness. Those actions can also lower your chances of developing some of these common conditions. What should I know about menopause? During menopause, you may experience a number of symptoms, such as:  Moderate-to-severe hot flashes.  Night sweats.  Decrease in sex drive.  Mood swings.  Headaches.  Tiredness.  Irritability.  Memory problems.  Insomnia. Choosing to treat or not to treat menopausal changes is an individual decision that you make with your health care provider. What should I know about hormone replacement therapy and supplements? Hormone therapy products are effective for treating symptoms that are associated with menopause, such as hot flashes and night sweats. Hormone replacement carries certain risks, especially as you become older. If you are thinking about using estrogen or estrogen with progestin treatments, discuss the benefits and risks with your health care provider. What should I know about heart disease and stroke? Heart disease, heart attack, and stroke become more likely as you age. This may be due, in part, to the hormonal changes that your body experiences during menopause. These can affect how your body processes dietary fats, triglycerides, and cholesterol. Heart attack and stroke are both medical emergencies. There are many things that you can do to help prevent heart disease  and stroke:  Have your blood pressure checked at least every 1-2 years. High blood pressure causes heart disease and increases the risk of stroke.  If you are 79-72 years old, ask your health care provider if you should take aspirin to prevent a heart attack or a stroke.  Do not use any tobacco products, including cigarettes, chewing tobacco, or electronic cigarettes. If you need help quitting, ask your health care provider.  It is important to eat a healthy diet and maintain a healthy weight. ? Be sure to include plenty of vegetables, fruits, low-fat dairy products, and lean protein. ? Avoid eating foods that are high in solid fats, added sugars, or salt (sodium).  Get regular exercise. This is one of the most important things that you can do for your health. ? Try to exercise for at least 150 minutes each week. The type of exercise that you do should increase your heart rate and make you sweat. This is known as moderate-intensity exercise. ? Try to do strengthening exercises at least twice each week. Do these in addition to the moderate-intensity exercise.  Know your numbers.Ask your health care provider to check your cholesterol and your blood glucose. Continue to have your blood tested as directed by your health care provider.  What should I know about cancer screening? There are several types of cancer. Take the following steps to reduce your risk and to catch any cancer development as early as possible. Breast Cancer  Practice breast self-awareness. ? This means understanding how your breasts normally appear and feel. ? It also means doing regular breast self-exams. Let your health care provider know about any changes, no matter how small.  If you are 40 or  older, have a clinician do a breast exam (clinical breast exam or CBE) every year. Depending on your age, family history, and medical history, it may be recommended that you also have a yearly breast X-ray (mammogram).  If you  have a family history of breast cancer, talk with your health care provider about genetic screening.  If you are at high risk for breast cancer, talk with your health care provider about having an MRI and a mammogram every year.  Breast cancer (BRCA) gene test is recommended for women who have family members with BRCA-related cancers. Results of the assessment will determine the need for genetic counseling and BRCA1 and for BRCA2 testing. BRCA-related cancers include these types: ? Breast. This occurs in males or females. ? Ovarian. ? Tubal. This may also be called fallopian tube cancer. ? Cancer of the abdominal or pelvic lining (peritoneal cancer). ? Prostate. ? Pancreatic. Cervical, Uterine, and Ovarian Cancer Your health care provider may recommend that you be screened regularly for cancer of the pelvic organs. These include your ovaries, uterus, and vagina. This screening involves a pelvic exam, which includes checking for microscopic changes to the surface of your cervix (Pap test).  For women ages 21-65, health care providers may recommend a pelvic exam and a Pap test every three years. For women ages 39-65, they may recommend the Pap test and pelvic exam, combined with testing for human papilloma virus (HPV), every five years. Some types of HPV increase your risk of cervical cancer. Testing for HPV may also be done on women of any age who have unclear Pap test results.  Other health care providers may not recommend any screening for nonpregnant women who are considered low risk for pelvic cancer and have no symptoms. Ask your health care provider if a screening pelvic exam is right for you.  If you have had past treatment for cervical cancer or a condition that could lead to cancer, you need Pap tests and screening for cancer for at least 20 years after your treatment. If Pap tests have been discontinued for you, your risk factors (such as having a new sexual partner) need to be reassessed  to determine if you should start having screenings again. Some women have medical problems that increase the chance of getting cervical cancer. In these cases, your health care provider may recommend that you have screening and Pap tests more often.  If you have a family history of uterine cancer or ovarian cancer, talk with your health care provider about genetic screening.  If you have vaginal bleeding after reaching menopause, tell your health care provider.  There are currently no reliable tests available to screen for ovarian cancer. Lung Cancer Lung cancer screening is recommended for adults 57-50 years old who are at high risk for lung cancer because of a history of smoking. A yearly low-dose CT scan of the lungs is recommended if you:  Currently smoke.  Have a history of at least 30 pack-years of smoking and you currently smoke or have quit within the past 15 years. A pack-year is smoking an average of one pack of cigarettes per day for one year. Yearly screening should:  Continue until it has been 15 years since you quit.  Stop if you develop a health problem that would prevent you from having lung cancer treatment. Colorectal Cancer  This type of cancer can be detected and can often be prevented.  Routine colorectal cancer screening usually begins at age 12 and continues through  age 75.  If you have risk factors for colon cancer, your health care provider may recommend that you be screened at an earlier age.  If you have a family history of colorectal cancer, talk with your health care provider about genetic screening.  Your health care provider may also recommend using home test kits to check for hidden blood in your stool.  A small camera at the end of a tube can be used to examine your colon directly (sigmoidoscopy or colonoscopy). This is done to check for the earliest forms of colorectal cancer.  Direct examination of the colon should be repeated every 5-10 years until  age 75. However, if early forms of precancerous polyps or small growths are found or if you have a family history or genetic risk for colorectal cancer, you may need to be screened more often. Skin Cancer  Check your skin from head to toe regularly.  Monitor any moles. Be sure to tell your health care provider: ? About any new moles or changes in moles, especially if there is a change in a mole's shape or color. ? If you have a mole that is larger than the size of a pencil eraser.  If any of your family members has a history of skin cancer, especially at a young age, talk with your health care provider about genetic screening.  Always use sunscreen. Apply sunscreen liberally and repeatedly throughout the day.  Whenever you are outside, protect yourself by wearing long sleeves, pants, a wide-brimmed hat, and sunglasses. What should I know about osteoporosis? Osteoporosis is a condition in which bone destruction happens more quickly than new bone creation. After menopause, you may be at an increased risk for osteoporosis. To help prevent osteoporosis or the bone fractures that can happen because of osteoporosis, the following is recommended:  If you are 19-50 years old, get at least 1,000 mg of calcium and at least 600 mg of vitamin D per day.  If you are older than age 50 but younger than age 70, get at least 1,200 mg of calcium and at least 600 mg of vitamin D per day.  If you are older than age 70, get at least 1,200 mg of calcium and at least 800 mg of vitamin D per day. Smoking and excessive alcohol intake increase the risk of osteoporosis. Eat foods that are rich in calcium and vitamin D, and do weight-bearing exercises several times each week as directed by your health care provider. What should I know about how menopause affects my mental health? Depression may occur at any age, but it is more common as you become older. Common symptoms of depression include:  Low or sad  mood.  Changes in sleep patterns.  Changes in appetite or eating patterns.  Feeling an overall lack of motivation or enjoyment of activities that you previously enjoyed.  Frequent crying spells. Talk with your health care provider if you think that you are experiencing depression. What should I know about immunizations? It is important that you get and maintain your immunizations. These include:  Tetanus, diphtheria, and pertussis (Tdap) booster vaccine.  Influenza every year before the flu season begins.  Pneumonia vaccine.  Shingles vaccine. Your health care provider may also recommend other immunizations. This information is not intended to replace advice given to you by your health care provider. Make sure you discuss any questions you have with your health care provider. Document Released: 06/20/2005 Document Revised: 11/16/2015 Document Reviewed: 01/30/2015 Elsevier Interactive Patient Education    2019 [place].

## 2018-04-29 NOTE — Assessment & Plan Note (Signed)
Situational Controlled off meds Support offered today

## 2018-04-30 LAB — HEPATITIS C ANTIBODY
HEP C AB: NONREACTIVE
SIGNAL TO CUT-OFF: 0.01 (ref <1.00)

## 2018-05-31 ENCOUNTER — Other Ambulatory Visit: Payer: Self-pay | Admitting: Internal Medicine

## 2018-05-31 DIAGNOSIS — Z1231 Encounter for screening mammogram for malignant neoplasm of breast: Secondary | ICD-10-CM

## 2018-05-31 DIAGNOSIS — H35371 Puckering of macula, right eye: Secondary | ICD-10-CM | POA: Diagnosis not present

## 2018-07-02 NOTE — Addendum Note (Signed)
Addended by: Lurlean Nanny on: 07/02/2018 01:01 PM   Modules accepted: Orders

## 2018-07-08 ENCOUNTER — Ambulatory Visit
Admission: RE | Admit: 2018-07-08 | Discharge: 2018-07-08 | Disposition: A | Discharge status: Home / Self Care | Payer: Medicare Other | Source: Ambulatory Visit | Attending: Internal Medicine | Admitting: Internal Medicine

## 2018-07-08 DIAGNOSIS — Z780 Asymptomatic menopausal state: Secondary | ICD-10-CM | POA: Diagnosis not present

## 2018-07-08 DIAGNOSIS — Z1231 Encounter for screening mammogram for malignant neoplasm of breast: Secondary | ICD-10-CM | POA: Diagnosis not present

## 2018-07-08 DIAGNOSIS — M85852 Other specified disorders of bone density and structure, left thigh: Secondary | ICD-10-CM | POA: Diagnosis not present

## 2018-07-19 ENCOUNTER — Encounter: Payer: Self-pay | Admitting: Internal Medicine

## 2018-07-19 MED ORDER — LEVOTHYROXINE SODIUM 75 MCG PO TABS: ORAL_TABLET | ORAL | 1 refills | Status: DC

## 2018-10-08 DIAGNOSIS — M5136 Other intervertebral disc degeneration, lumbar region: Secondary | ICD-10-CM | POA: Diagnosis not present

## 2018-10-08 DIAGNOSIS — M9903 Segmental and somatic dysfunction of lumbar region: Secondary | ICD-10-CM | POA: Diagnosis not present

## 2018-10-08 DIAGNOSIS — M9901 Segmental and somatic dysfunction of cervical region: Secondary | ICD-10-CM | POA: Diagnosis not present

## 2018-10-08 DIAGNOSIS — M531 Cervicobrachial syndrome: Secondary | ICD-10-CM | POA: Diagnosis not present

## 2018-11-01 ENCOUNTER — Other Ambulatory Visit: Payer: Self-pay | Admitting: Internal Medicine

## 2018-11-24 DIAGNOSIS — M5441 Lumbago with sciatica, right side: Secondary | ICD-10-CM | POA: Diagnosis not present

## 2018-11-24 DIAGNOSIS — M5134 Other intervertebral disc degeneration, thoracic region: Secondary | ICD-10-CM | POA: Diagnosis not present

## 2018-11-24 DIAGNOSIS — M5136 Other intervertebral disc degeneration, lumbar region: Secondary | ICD-10-CM | POA: Diagnosis not present

## 2018-11-24 DIAGNOSIS — M9903 Segmental and somatic dysfunction of lumbar region: Secondary | ICD-10-CM | POA: Diagnosis not present

## 2018-11-24 DIAGNOSIS — M9902 Segmental and somatic dysfunction of thoracic region: Secondary | ICD-10-CM | POA: Diagnosis not present

## 2018-11-30 DIAGNOSIS — M9902 Segmental and somatic dysfunction of thoracic region: Secondary | ICD-10-CM | POA: Diagnosis not present

## 2018-11-30 DIAGNOSIS — M5441 Lumbago with sciatica, right side: Secondary | ICD-10-CM | POA: Diagnosis not present

## 2018-11-30 DIAGNOSIS — M5136 Other intervertebral disc degeneration, lumbar region: Secondary | ICD-10-CM | POA: Diagnosis not present

## 2018-11-30 DIAGNOSIS — M5134 Other intervertebral disc degeneration, thoracic region: Secondary | ICD-10-CM | POA: Diagnosis not present

## 2018-11-30 DIAGNOSIS — M9903 Segmental and somatic dysfunction of lumbar region: Secondary | ICD-10-CM | POA: Diagnosis not present

## 2018-12-13 ENCOUNTER — Other Ambulatory Visit: Payer: Self-pay

## 2019-01-13 ENCOUNTER — Other Ambulatory Visit: Payer: Self-pay

## 2019-01-13 DIAGNOSIS — Z20822 Contact with and (suspected) exposure to covid-19: Secondary | ICD-10-CM

## 2019-01-13 DIAGNOSIS — R6889 Other general symptoms and signs: Secondary | ICD-10-CM | POA: Diagnosis not present

## 2019-01-14 LAB — NOVEL CORONAVIRUS, NAA: SARS-CoV-2, NAA: NOT DETECTED

## 2019-02-01 DIAGNOSIS — M5136 Other intervertebral disc degeneration, lumbar region: Secondary | ICD-10-CM | POA: Diagnosis not present

## 2019-02-01 DIAGNOSIS — M9903 Segmental and somatic dysfunction of lumbar region: Secondary | ICD-10-CM | POA: Diagnosis not present

## 2019-02-01 DIAGNOSIS — M9901 Segmental and somatic dysfunction of cervical region: Secondary | ICD-10-CM | POA: Diagnosis not present

## 2019-02-01 DIAGNOSIS — M531 Cervicobrachial syndrome: Secondary | ICD-10-CM | POA: Diagnosis not present

## 2019-02-17 DIAGNOSIS — S66912A Strain of unspecified muscle, fascia and tendon at wrist and hand level, left hand, initial encounter: Secondary | ICD-10-CM | POA: Diagnosis not present

## 2019-02-17 DIAGNOSIS — M67834 Other specified disorders of tendon, left wrist: Secondary | ICD-10-CM | POA: Diagnosis not present

## 2019-02-22 ENCOUNTER — Ambulatory Visit (INDEPENDENT_AMBULATORY_CARE_PROVIDER_SITE_OTHER): Payer: Medicare Other

## 2019-02-22 DIAGNOSIS — Z23 Encounter for immunization: Secondary | ICD-10-CM | POA: Diagnosis not present

## 2019-03-09 DIAGNOSIS — L720 Epidermal cyst: Secondary | ICD-10-CM | POA: Diagnosis not present

## 2019-03-09 DIAGNOSIS — L820 Inflamed seborrheic keratosis: Secondary | ICD-10-CM | POA: Diagnosis not present

## 2019-03-09 DIAGNOSIS — L918 Other hypertrophic disorders of the skin: Secondary | ICD-10-CM | POA: Diagnosis not present

## 2019-03-09 DIAGNOSIS — L821 Other seborrheic keratosis: Secondary | ICD-10-CM | POA: Diagnosis not present

## 2019-03-09 DIAGNOSIS — D229 Melanocytic nevi, unspecified: Secondary | ICD-10-CM | POA: Diagnosis not present

## 2019-03-09 DIAGNOSIS — L570 Actinic keratosis: Secondary | ICD-10-CM | POA: Diagnosis not present

## 2019-04-15 ENCOUNTER — Encounter: Payer: Self-pay | Admitting: Internal Medicine

## 2019-04-15 MED ORDER — OMEPRAZOLE 20 MG PO CPDR: 20.0000 mg | DELAYED_RELEASE_CAPSULE | Freq: Every day | ORAL | 1 refills | Status: DC

## 2019-04-15 NOTE — Telephone Encounter (Signed)
Pt is requesting Rx for Omeprazole 20mg  as she has been taking OTC and it is working for her. Pt advised she needs to schedule CPE

## 2019-04-28 ENCOUNTER — Other Ambulatory Visit: Payer: Self-pay | Admitting: Internal Medicine

## 2019-04-29 ENCOUNTER — Encounter: Payer: Self-pay | Admitting: Internal Medicine

## 2019-04-29 MED ORDER — LEVOTHYROXINE SODIUM 75 MCG PO TABS: ORAL_TABLET | ORAL | 0 refills | Status: DC

## 2019-04-29 NOTE — Telephone Encounter (Signed)
Already refilled duplicate request

## 2019-05-05 ENCOUNTER — Ambulatory Visit: Payer: Medicare Other | Attending: Internal Medicine

## 2019-05-05 DIAGNOSIS — Z20822 Contact with and (suspected) exposure to covid-19: Secondary | ICD-10-CM

## 2019-05-06 LAB — NOVEL CORONAVIRUS, NAA: SARS-CoV-2, NAA: NOT DETECTED

## 2019-05-11 ENCOUNTER — Ambulatory Visit: Payer: Medicare Other | Attending: Internal Medicine

## 2019-05-11 ENCOUNTER — Encounter: Payer: Self-pay | Admitting: Internal Medicine

## 2019-05-11 DIAGNOSIS — Z20822 Contact with and (suspected) exposure to covid-19: Secondary | ICD-10-CM

## 2019-05-12 LAB — NOVEL CORONAVIRUS, NAA: SARS-CoV-2, NAA: DETECTED — AB

## 2019-05-13 ENCOUNTER — Encounter: Payer: Self-pay | Admitting: Infectious Diseases

## 2019-05-25 ENCOUNTER — Encounter: Payer: Self-pay | Admitting: Internal Medicine

## 2019-05-26 MED ORDER — LEVOTHYROXINE SODIUM 75 MCG PO TABS: ORAL_TABLET | ORAL | 0 refills | Status: DC

## 2019-05-26 MED ORDER — OMEPRAZOLE 20 MG PO CPDR: 20.0000 mg | DELAYED_RELEASE_CAPSULE | Freq: Every day | ORAL | 0 refills | Status: DC

## 2019-07-07 ENCOUNTER — Other Ambulatory Visit: Payer: Self-pay

## 2019-07-07 ENCOUNTER — Ambulatory Visit (INDEPENDENT_AMBULATORY_CARE_PROVIDER_SITE_OTHER): Payer: Medicare Other | Admitting: Internal Medicine

## 2019-07-07 ENCOUNTER — Encounter: Payer: Self-pay | Admitting: Internal Medicine

## 2019-07-07 VITALS — BP 122/80 | HR 82 | Temp 97.9°F | Ht 66.0 in | Wt 197.0 lb

## 2019-07-07 DIAGNOSIS — R0989 Other specified symptoms and signs involving the circulatory and respiratory systems: Secondary | ICD-10-CM

## 2019-07-07 DIAGNOSIS — M778 Other enthesopathies, not elsewhere classified: Secondary | ICD-10-CM

## 2019-07-07 DIAGNOSIS — F411 Generalized anxiety disorder: Secondary | ICD-10-CM

## 2019-07-07 DIAGNOSIS — E7800 Pure hypercholesterolemia, unspecified: Secondary | ICD-10-CM | POA: Diagnosis not present

## 2019-07-07 DIAGNOSIS — K219 Gastro-esophageal reflux disease without esophagitis: Secondary | ICD-10-CM

## 2019-07-07 DIAGNOSIS — E039 Hypothyroidism, unspecified: Secondary | ICD-10-CM

## 2019-07-07 DIAGNOSIS — R252 Cramp and spasm: Secondary | ICD-10-CM

## 2019-07-07 DIAGNOSIS — E559 Vitamin D deficiency, unspecified: Secondary | ICD-10-CM

## 2019-07-07 DIAGNOSIS — Z0000 Encounter for general adult medical examination without abnormal findings: Secondary | ICD-10-CM

## 2019-07-07 LAB — COMPREHENSIVE METABOLIC PANEL
ALT: 31 U/L (ref 0–35)
AST: 22 U/L (ref 0–37)
Albumin: 4.2 g/dL (ref 3.5–5.2)
Alkaline Phosphatase: 60 U/L (ref 39–117)
BUN: 19 mg/dL (ref 6–23)
CO2: 31 mEq/L (ref 19–32)
Calcium: 10.1 mg/dL (ref 8.4–10.5)
Chloride: 100 mEq/L (ref 96–112)
Creatinine, Ser: 1.05 mg/dL (ref 0.40–1.20)
GFR: 52.06 mL/min — ABNORMAL LOW (ref >60.00)
Glucose, Bld: 101 mg/dL — ABNORMAL HIGH (ref 70–99)
Potassium: 4.6 mEq/L (ref 3.5–5.1)
Sodium: 139 mEq/L (ref 135–145)
Total Bilirubin: 0.8 mg/dL (ref 0.2–1.2)
Total Protein: 7 g/dL (ref 6.0–8.3)

## 2019-07-07 LAB — TSH: TSH: 1.47 u[IU]/mL (ref 0.35–4.50)

## 2019-07-07 LAB — LIPID PANEL
Cholesterol: 255 mg/dL — ABNORMAL HIGH (ref 0–200)
HDL: 77.8 mg/dL (ref >39.00)
LDL Cholesterol: 159 mg/dL — ABNORMAL HIGH (ref 0–99)
NonHDL: 177.38
Total CHOL/HDL Ratio: 3
Triglycerides: 94 mg/dL (ref 0.0–149.0)
VLDL: 18.8 mg/dL (ref 0.0–40.0)

## 2019-07-07 LAB — VITAMIN D 25 HYDROXY (VIT D DEFICIENCY, FRACTURES): VITD: 71.52 ng/mL (ref 30.00–100.00)

## 2019-07-07 LAB — T4, FREE: Free T4: 0.94 ng/dL (ref 0.60–1.60)

## 2019-07-07 LAB — MAGNESIUM: Magnesium: 2 mg/dL (ref 1.5–2.5)

## 2019-07-07 MED ORDER — MELOXICAM 15 MG PO TABS: 15.0000 mg | ORAL_TABLET | Freq: Every day | ORAL | 3 refills | Status: DC

## 2019-07-07 NOTE — Assessment & Plan Note (Signed)
Meloxicam refilled today CMET ordered

## 2019-07-07 NOTE — Patient Instructions (Signed)

## 2019-07-07 NOTE — Assessment & Plan Note (Signed)
Avoid foods that trigger your reflux Continue Omeprazole CBC and CMET today Referral to GI for globus sensation- needs upper GI

## 2019-07-07 NOTE — Assessment & Plan Note (Signed)
Controlled off meds  Will monitor 

## 2019-07-07 NOTE — Assessment & Plan Note (Signed)
CMET and lipid profile today Encouraged her to consume a low fat diet 

## 2019-07-07 NOTE — Progress Notes (Signed)
HPI:  Patient presents to the clinic today for her subsequent annual Medicare wellness exam.  She is also due to follow-up chronic conditions.  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 She typically drinks wine and bible study to help relieve this stress. She does not see a therapist. She denies depression, SI/HI.  GERD: Triggered by alcohol, tomato based foods. She denies breakthrough on Omeprazole but is having a hard time swallowing large pills.  There is no upper GI on file  HLD: Her last LDL was 134, 03/2009.  She eating any cholesterol-lowering medication OTC.  She does not consume a low-fat diet.  Hypothyroidism: She denies any issues on her current dose of Levothyroxine.  She does not follow with endocrinology.  Tendonitis, Left Wrist: She would like for me to fill her Meloxicam for a 90 day supply.  Past Medical History:  Diagnosis Date  . Anxiety   . Complication of anesthesia   . GERD (gastroesophageal reflux disease)   . Hyperlipidemia   . Hypothyroidism   . PONV (postoperative nausea and vomiting)   . Thyroid disease     Current Outpatient Medications  Medication Sig Dispense Refill  . Calcium Carb-Cholecalciferol (CALCIUM 600 + D PO) Take 1 tablet by mouth daily.    . Cholecalciferol (VITAMIN D) 2000 UNITS tablet Take 2,000 Units by mouth daily.    . Cyanocobalamin (VITAMIN B 12 PO) Take 1 tablet by mouth daily.    . fexofenadine (ALLEGRA) 180 MG tablet Take 0.5 tablets (90 mg total) by mouth as needed for allergies or rhinitis. 90 tablet 3  . fluticasone (FLONASE) 50 MCG/ACT nasal spray Place 1 spray into both nostrils as needed for allergies or rhinitis. 16 g 1  . levothyroxine (SYNTHROID) 75 MCG tablet TAKE 1 TABLET BY MOUTH ONCE A DAY BEFOREBREAKFAST. 90 tablet 0  . Misc Natural Products (COSAMIN ASU ADVANCED FORMULA PO) Take 1 tablet by mouth daily.    . Multiple Vitamin (MULTI VITAMIN DAILY PO) Take 1  tablet by mouth daily.    Marland Kitchen omeprazole (PRILOSEC) 20 MG capsule Take 1 capsule (20 mg total) by mouth daily. 90 capsule 0  . Probiotic Product (PROBIOTIC DAILY) CAPS Take 1 capsule by mouth daily.     No current facility-administered medications for this visit.    Allergies  Allergen Reactions  . Sulfa Antibiotics Hives  . Codeine Nausea And Vomiting  . Penicillins Hives    Family History  Problem Relation Age of Onset  . Cancer Father        Lung Cancer- smoker  . Breast cancer Neg Hx     Social History   Socioeconomic History  . Marital status: Married    Spouse name: Not on file  . Number of children: Not on file  . Years of education: Not on file  . Highest education level: Not on file  Occupational History  . Not on file  Tobacco Use  . Smoking status: Never Smoker  . Smokeless tobacco: Never Used  Substance and Sexual Activity  . Alcohol use: Yes    Alcohol/week: 1.0 standard drinks    Types: 1 Glasses of wine per week  . Drug use: No  . Sexual activity: Yes    Partners: Male    Comment: Husband  Other Topics Concern  . 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 Difficulty of Paying Living Expenses: Not on file  Food Insecurity:   . Worried About Charity fundraiser in the Last Year: Not on file  . Ran Out of Food in the Last Year: Not on file  Transportation Needs:   . Lack of Transportation (Medical): Not on file  . Lack of Transportation (Non-Medical): Not on file  Physical Activity:   . Days of Exercise per Week: Not on file  . Minutes of Exercise per Session: Not on file  Stress:   . Feeling of Stress : Not on file  Social Connections:   . Frequency of Communication with Friends and Family: Not on file  .  Frequency of Social Gatherings with Friends and Family: Not on file  . Attends Religious Services: Not on file  . Active Member of Clubs or Organizations: Not on file  . Attends Archivist Meetings: Not on file  . Marital Status: Not on file  Intimate Partner Violence:   . Fear of Current or Ex-Partner: Not on file  . Emotionally Abused: Not on file  . Physically Abused: Not on file  . Sexually Abused: Not on file    Hospitiliaztions: None  Health Maintenance:    Flu: 02/2019  Tetanus: 2010  Pneumovax: 04/2018  Prevnar: 03/2017  Zostavax: ? 2013  Shingrix: never  Covid: 06/28/19, scheduled 07/19/19  Mammogram: 06/2018  Pap Smear: total hysterectomy  Bone Density: 06/2018  Colon Screening: 12/2017, [facility]  Eye Doctor: annually  Dental Exam: biannually   Providers:   [id number] Jerah Esty, NP   I have personally reviewed and have noted:  1. The patient's medical and social history 2. Their use of alcohol, tobacco or illicit drugs 3. Their current medications and supplements 4. The patient's functional ability including ADL's, fall risks, home safety risks and hearing or visual impairment. 5. Diet and physical activities 6. Evidence for depression or mood disorder  Subjective:   Review of Systems:   Constitutional: Denies fever, malaise, fatigue, headache or abrupt weight changes.  HEENT: Denies eye pain, eye redness, ear pain, ringing in the ears, wax buildup, runny nose, nasal congestion, bloody nose, or sore throat. Respiratory: Denies difficulty breathing, shortness of breath, cough or sputum production.   Cardiovascular: Denies chest pain, chest tightness, palpitations or swelling in the hands or feet.  Gastrointestinal: Denies abdominal pain, bloating, constipation, diarrhea or blood in the stool.  GU: Denies urgency, frequency, pain with urination, burning sensation, blood in urine, odor or discharge. Musculoskeletal: Pt reports nocturnal leg camps. Denies  decrease in range of motion, difficulty with gait, or joint pain and swelling.  Skin: Denies redness, rashes, lesions or ulcercations.  Neurological: Denies dizziness, difficulty with memory, difficulty with speech or problems with balance and coordination.  Psych: Pt reports anxiety. Denies depression, SI/HI.  No other specific complaints in a complete review of systems (except as listed in HPI above).  Objective:  PE:  BP 122/80   Pulse 82   Temp 97.9 F (36.6 C) (Temporal)   Ht 5\' 6"  (1.676 m)   Wt 197 lb (89.4 kg)   SpO2 97%   BMI 31.80 kg/m   Wt Readings from Last 3 Encounters:  04/29/18 194 lb (88 kg)  10/08/17 187 lb (84.8 kg)  03/24/17  194 lb (88 kg)    General: Appears her stated age, obese, in NAD. Skin: Warm, dry and intact. No rashes, lesions or ulcerations noted. HEENT: Head: normal shape and size; Eyes: sclera white, no icterus, conjunctiva pink, PERRLA and EOMs intact;  Neck: Neck supple, trachea midline. No masses, lumps or thyromegaly present.  Cardiovascular: Normal rate and rhythm. S1,S2 noted.  No murmur, rubs or gallops noted. No JVD or BLE edema. No carotid bruits noted. Pulmonary/Chest: Normal effort and positive vesicular breath sounds. No respiratory distress. No wheezes, rales or ronchi noted.  Abdomen: Soft, mildly tender in the epigastric region. Normal bowel sounds. No distention or masses noted. Liver, spleen and kidneys non palpable. Musculoskeletal: Strength 5/5 BUE/BLE. No signs of joint swelling.  Neurological: Alert and oriented. Cranial nerves II-XII grossly intact. Coordination normal.  Psychiatric: Mood and affect normal. Behavior is normal. Judgment and thought content normal.     BMET    Component Value Date/Time   NA 140 04/29/2018 1132   NA 140 12/05/2013 0000   K 4.6 04/29/2018 1132   CL 105 04/29/2018 1132   CO2 28 04/29/2018 1132   GLUCOSE 88 04/29/2018 1132   BUN 19 04/29/2018 1132   BUN 16 12/05/2013 0000   CREATININE  0.86 04/29/2018 1132   CALCIUM 9.8 04/29/2018 1132    Lipid Panel     Component Value Date/Time   CHOL 237 (H) 04/29/2018 1132   TRIG 127.0 04/29/2018 1132   HDL 77.50 04/29/2018 1132   CHOLHDL 3 04/29/2018 1132   VLDL 25.4 04/29/2018 1132   LDLCALC 134 (H) 04/29/2018 1132    CBC    Component Value Date/Time   WBC 5.5 04/29/2018 1132   RBC 4.58 04/29/2018 1132   HGB 13.8 04/29/2018 1132   HCT 41.3 04/29/2018 1132   PLT 226.0 04/29/2018 1132   MCV 90.2 04/29/2018 1132   MCHC 33.3 04/29/2018 1132   RDW 13.5 04/29/2018 1132   LYMPHSABS 1.7 12/20/2014 1700   MONOABS 0.4 12/20/2014 1700   EOSABS 0.2 12/20/2014 1700   BASOSABS 0.0 12/20/2014 1700    Hgb A1C Lab Results  Component Value Date   HGBA1C 5.7 02/01/2016      Assessment and Plan:   Medicare Annual Wellness Visit:  Diet: She does eat meat. She eats more veggies than fruits. She occassionally eats fried foods. She drinks mostly water, sweet tea. Physical activity: Occasional walking Depression/mood screen: Negative, PHQ 9 score of 0 Hearing: Intact to whispered voice Visual acuity: Grossly normal, performs annual eye exam  ADLs: Capable Fall risk: None Home safety: Good Cognitive evaluation: Intact to orientation, naming, recall and repetition EOL planning: No adv directives, full code/ I agree  Preventative Medicine: Flu, pneumovax, prevnar and zostovax UTD. She declines shingrix and tetanus at this time. She understands if she gets cut or bitten by an animal, she should go get a tetanus vaccine. Mammogram due- she will call to schedule. She no longer needs pap smears. Colon screening UTD. Bone density UTD. Encouraged her to consume a balanced diet and exercise regimen. Advised her to see an eye doctor and dentist annually. Will check CMET, Lipid, TSH, Free T4 and Vit D today..  Nocturnal Muscle Cramps:  Increase water intake Will check CMET and Mg today  Globus Sensation:  Referral to GI for upper  GI  Next appointment: 1 year Medicare wellness exam   Webb Silversmith, NP This visit occurred during the SARS-CoV-2 public health emergency.  Safety protocols were in  place, including screening questions prior to the visit, additional usage of staff PPE, and extensive cleaning of exam room while observing appropriate contact time as indicated for disinfecting solutions.

## 2019-07-07 NOTE — Assessment & Plan Note (Signed)
TSH and Free T4 today Continue Levothyroxine for now

## 2019-07-08 ENCOUNTER — Encounter: Payer: Self-pay | Admitting: Internal Medicine

## 2019-07-11 ENCOUNTER — Other Ambulatory Visit: Payer: Self-pay | Admitting: Internal Medicine

## 2019-07-11 DIAGNOSIS — Z1231 Encounter for screening mammogram for malignant neoplasm of breast: Secondary | ICD-10-CM

## 2019-07-20 DIAGNOSIS — L720 Epidermal cyst: Secondary | ICD-10-CM | POA: Diagnosis not present

## 2019-07-20 DIAGNOSIS — D492 Neoplasm of unspecified behavior of bone, soft tissue, and skin: Secondary | ICD-10-CM | POA: Diagnosis not present

## 2019-07-21 ENCOUNTER — Encounter: Payer: Self-pay | Admitting: Internal Medicine

## 2019-08-02 DIAGNOSIS — M9903 Segmental and somatic dysfunction of lumbar region: Secondary | ICD-10-CM | POA: Diagnosis not present

## 2019-08-02 DIAGNOSIS — R519 Headache, unspecified: Secondary | ICD-10-CM | POA: Diagnosis not present

## 2019-08-02 DIAGNOSIS — M5416 Radiculopathy, lumbar region: Secondary | ICD-10-CM | POA: Diagnosis not present

## 2019-08-02 DIAGNOSIS — M67834 Other specified disorders of tendon, left wrist: Secondary | ICD-10-CM | POA: Diagnosis not present

## 2019-08-02 DIAGNOSIS — M9901 Segmental and somatic dysfunction of cervical region: Secondary | ICD-10-CM | POA: Diagnosis not present

## 2019-08-12 ENCOUNTER — Other Ambulatory Visit: Payer: Self-pay | Admitting: Internal Medicine

## 2019-08-29 DIAGNOSIS — M9903 Segmental and somatic dysfunction of lumbar region: Secondary | ICD-10-CM | POA: Diagnosis not present

## 2019-08-29 DIAGNOSIS — R519 Headache, unspecified: Secondary | ICD-10-CM | POA: Diagnosis not present

## 2019-08-29 DIAGNOSIS — M9901 Segmental and somatic dysfunction of cervical region: Secondary | ICD-10-CM | POA: Diagnosis not present

## 2019-08-29 DIAGNOSIS — M5416 Radiculopathy, lumbar region: Secondary | ICD-10-CM | POA: Diagnosis not present

## 2019-09-02 ENCOUNTER — Encounter: Payer: Self-pay | Admitting: *Deleted

## 2019-09-06 ENCOUNTER — Ambulatory Visit (INDEPENDENT_AMBULATORY_CARE_PROVIDER_SITE_OTHER): Payer: Medicare Other | Admitting: Internal Medicine

## 2019-09-06 ENCOUNTER — Ambulatory Visit
Admission: RE | Admit: 2019-09-06 | Discharge: 2019-09-06 | Disposition: A | Discharge status: Home / Self Care | Payer: Medicare Other | Source: Ambulatory Visit | Attending: Internal Medicine | Admitting: Internal Medicine

## 2019-09-06 ENCOUNTER — Encounter: Payer: Self-pay | Admitting: Internal Medicine

## 2019-09-06 VITALS — BP 100/68 | HR 86 | Temp 98.3°F | Ht 67.0 in | Wt 196.0 lb

## 2019-09-06 DIAGNOSIS — R1310 Dysphagia, unspecified: Secondary | ICD-10-CM

## 2019-09-06 DIAGNOSIS — Z1231 Encounter for screening mammogram for malignant neoplasm of breast: Secondary | ICD-10-CM

## 2019-09-06 DIAGNOSIS — R1013 Epigastric pain: Secondary | ICD-10-CM | POA: Diagnosis not present

## 2019-09-06 DIAGNOSIS — K219 Gastro-esophageal reflux disease without esophagitis: Secondary | ICD-10-CM | POA: Diagnosis not present

## 2019-09-06 NOTE — Patient Instructions (Signed)
You have been scheduled for an endoscopy. Please follow written instructions given to you at your visit today. If you use inhalers (even only as needed), please bring them with you on the day of your procedure. Your physician has requested that you go to www.startemmi.com and enter the access code given to you at your visit today. This web site gives a general overview about your procedure. However, you should still follow specific instructions given to you by our office regarding your preparation for the procedure.  Please continue omeprazole.  If you are age 82 or older, your body mass index should be between 23-30. Your Body mass index is 30.7 kg/m. If this is out of the aforementioned range listed, please consider follow up with your Primary Care Provider.  If you are age 68 or younger, your body mass index should be between 19-25. Your Body mass index is 30.7 kg/m. If this is out of the aformentioned range listed, please consider follow up with your Primary Care Provider.   Due to recent changes in healthcare laws, you may see the results of your imaging and laboratory studies on MyChart before your provider has had a chance to review them.  We understand that in some cases there may be results that are confusing or concerning to you. Not all laboratory results come back in the same time frame and the provider may be waiting for multiple results in order to interpret others.  Please give [place] 48 hours in order for your provider to thoroughly review all the results before contacting the office for clarification of your results.

## 2019-09-06 NOTE — Progress Notes (Signed)
Patient ID: Sarah Jacobson, female   DOB: 1950/10/17, 69 y.o.   MRN: Sarah Jacobson HPI: Sarah Jacobson is a 69 year old female with a past medical history of GERD, colon polyps, colonic diverticulosis with history of diverticulitis, hemorrhoids status post banding, hypothyroidism, hyperlipidemia who is seen to discuss reflux, epigastric abdominal pain and dysphagia.  She is here alone today.  She was previously cared for by Dr. Verta [place] in [place] and most recently in 2019 by Dr. Earlie Jacobson.  She had a colonoscopy in 2019 for surveillance and we have requested these records.  After this she was treated with hemorrhoidal banding for symptomatic internal hemorrhoids.  She reports she had a hemorrhoid flare in 2019 and what sounds like a thrombosed hemorrhoid after being treated for diverticulitis.  From a reflux perspective she has had heartburn for years.  She is taking omeprazole 20 mg a day.  This definitively helped with heartburn but if she eats a diet high in acidic foods such as [place] food or drinks red wine her heartburn will definitely flare at night.  She has significant pill dysphagia and recalls an episode recently where a pill became lodged in her esophagus.  This led her to panic and even consider asking her husband to do the Heimlich maneuver.  She ended up drinking very warm water to help dissolve the pill.  She takes small bites and tries to over chew her food.  Solid foods like steak can also transit slowly or need to be pushed down with fluids.  Separate from this she has intermittent epigastric pain particularly when she eats raw fruits and vegetables.  This is specifically difficult with avocado, banana and pineapple.  Occurs about 30 minutes after eating and has had nausea association.  No vomiting.  She does take frequent and near daily NSAIDs.  She was on meloxicam for some time but now is using Aleve or Advil most days for back and joint pain.  Past Medical History:  Diagnosis Date   . Anxiety   . Complication of anesthesia   . COVID-19   . External hemorrhoids   . GERD (gastroesophageal reflux disease)   . Hyperlipidemia   . Hypothyroidism   . Internal hemorrhoids   . PONV (postoperative nausea and vomiting)   . Thyroid disease     Past Surgical History:  Procedure Laterality Date  . ABDOMINAL HYSTERECTOMY  1982/2000   uterus/ovaries  . CATARACT EXTRACTION W/PHACO Right 07/30/2016   Procedure: CATARACT EXTRACTION PHACO AND INTRAOCULAR LENS PLACEMENT (IOC);  Surgeon: Sarah Cotta, MD;  Location: [facility] ORS;  Service: Ophthalmology;  Laterality: Right;  [place] 02:01 AP% 26.3 CDE 58.46 fluid pack lot # [id number]  . ROTATOR CUFF REPAIR  1986   Both shoulders  . TONSILLECTOMY AND ADENOIDECTOMY  1992    Outpatient Medications Prior to Visit  Medication Sig Dispense Refill  . Calcium Carb-Cholecalciferol (CALCIUM 600 + D PO) Take 1 tablet by mouth daily.    . Cholecalciferol (VITAMIN D) 2000 UNITS tablet Take 2,000 Units by mouth daily.    . Cyanocobalamin (VITAMIN B 12 PO) Take 1 tablet by mouth daily.    Marland Kitchen levothyroxine (SYNTHROID) 75 MCG tablet Take 75 mcg by mouth daily before breakfast.    . meloxicam (MOBIC) 15 MG tablet Take 1 tablet (15 mg total) by mouth daily. 90 tablet 3  . Misc Natural Products (COSAMIN ASU ADVANCED FORMULA PO) Take 1 tablet by mouth daily.    . Multiple Vitamin (MULTI VITAMIN DAILY PO) Take  1 tablet by mouth daily.    Marland Kitchen omeprazole (PRILOSEC) 20 MG capsule TAKE 1 CAPSULE DAILY 90 capsule 0  . Probiotic Product (PROBIOTIC DAILY) CAPS Take 1 capsule by mouth daily.    Marland Kitchen Specialty Vitamins Products (MAGNESIUM, AMINO ACID CHELATE,) 133 MG tablet Take 1 tablet by mouth daily.    . fexofenadine (ALLEGRA) 180 MG tablet Take 0.5 tablets (90 mg total) by mouth as needed for allergies or rhinitis. 90 tablet 3  . fluticasone (FLONASE) 50 MCG/ACT nasal spray Place 1 spray into both nostrils as needed for allergies or rhinitis. 16 g 1  .  levothyroxine (SYNTHROID) 75 MCG tablet TAKE 1 TABLET DAILY BEFORE BREAKFAST 90 tablet 0   No facility-administered medications prior to visit.    Allergies  Allergen Reactions  . Sulfa Antibiotics Hives  . Codeine Nausea And Vomiting  . Penicillins Hives    Family History  Problem Relation Age of Onset  . Lung cancer Father        smoker  . Breast cancer Neg Hx     Social History   Tobacco Use  . Smoking status: Never Smoker  . Smokeless tobacco: Never Used  Substance Use Topics  . Alcohol use: Yes    Alcohol/week: 1.0 standard drinks    Types: 1 Glasses of wine per week  . Drug use: No    ROS: As per history of present illness, otherwise negative  BP 100/68   Pulse 86   Temp 98.3 F (36.8 C)   Ht 5\' 7"  (1.702 m)   Wt 196 lb (88.9 kg)   BMI 30.70 kg/m  Gen: awake, alert, NAD HEENT: anicteric CV: RRR, no mrg Pulm: CTA b/l Abd: soft, epigastric tenderness without rebound or guarding, nondistended, +BS throughout Ext: no c/c/e Neuro: nonfocal   RELEVANT LABS AND IMAGING: CBC    Component Value Date/Time   WBC 5.5 04/29/2018 1132   RBC 4.58 04/29/2018 1132   HGB 13.8 04/29/2018 1132   HCT 41.3 04/29/2018 1132   PLT 226.0 04/29/2018 1132   MCV 90.2 04/29/2018 1132   MCHC 33.3 04/29/2018 1132   RDW 13.5 04/29/2018 1132   LYMPHSABS 1.7 12/20/2014 1700   MONOABS 0.4 12/20/2014 1700   EOSABS 0.2 12/20/2014 1700   BASOSABS 0.0 12/20/2014 1700    CMP     Component Value Date/Time   NA 139 07/07/2019 1231   NA 140 12/05/2013 0000   K 4.6 07/07/2019 1231   CL 100 07/07/2019 1231   CO2 31 07/07/2019 1231   GLUCOSE 101 (H) 07/07/2019 1231   BUN 19 07/07/2019 1231   BUN 16 12/05/2013 0000   CREATININE 1.05 07/07/2019 1231   CALCIUM 10.1 07/07/2019 1231   PROT 7.0 07/07/2019 1231   ALBUMIN 4.2 07/07/2019 1231   AST 22 07/07/2019 1231   ALT 31 07/07/2019 1231   ALKPHOS 60 07/07/2019 1231   BILITOT 0.8 07/07/2019 1231     ASSESSMENT/PLAN: 69 year old female with a past medical history of GERD, colon polyps, colonic diverticulosis with history of diverticulitis, hemorrhoids status post banding, hypothyroidism, hyperlipidemia who is seen to discuss reflux, epigastric abdominal pain and dysphagia.   1.  GERD/dysphagia/epigastric pain --upper endoscopy is recommended to evaluate esophageal dysphagia and longstanding GERD.  Rule out Barrett's esophagus, rule out stricture.  She may benefit from a dilation which we discussed today.  I am also suspicious for gastritis which may be NSAID related.  We may need to eventually increase omeprazole but will perform diagnostic  upper endoscopy first.  We discussed how PPI does provide some gastric protection from NSAID use but can be dose dependent.  We discussed the risk, benefits and alternatives of upper endoscopy and she is agreeable and wishes to proceed --EGD as above --Continue omeprazole 20 mg daily for now --If no evidence of gastritis or duodenitis and epigastric pain persists consider abdominal ultrasound to evaluate gallbladder  2.  History of colon polyps --records requested from Dr. Thana Farr to determine her last colonoscopy and also determine surveillance interval.  She reports her last exam was 2019      [id number], Sarah Jacobson, [address] [place],  [place] [id number]

## 2019-09-14 DIAGNOSIS — H1033 Unspecified acute conjunctivitis, bilateral: Secondary | ICD-10-CM | POA: Diagnosis not present

## 2019-09-19 ENCOUNTER — Encounter: Payer: Self-pay | Admitting: Internal Medicine

## 2019-09-19 ENCOUNTER — Telehealth: Payer: Self-pay | Admitting: Internal Medicine

## 2019-09-19 ENCOUNTER — Other Ambulatory Visit: Payer: Self-pay

## 2019-09-19 ENCOUNTER — Ambulatory Visit (AMBULATORY_SURGERY_CENTER): Payer: Medicare Other | Admitting: Internal Medicine

## 2019-09-19 VITALS — BP 133/59 | HR 64 | Temp 96.9°F | Resp 17 | Ht 67.0 in | Wt 196.0 lb

## 2019-09-19 DIAGNOSIS — K3189 Other diseases of stomach and duodenum: Secondary | ICD-10-CM | POA: Diagnosis not present

## 2019-09-19 DIAGNOSIS — K222 Esophageal obstruction: Secondary | ICD-10-CM | POA: Diagnosis not present

## 2019-09-19 DIAGNOSIS — H1033 Unspecified acute conjunctivitis, bilateral: Secondary | ICD-10-CM | POA: Diagnosis not present

## 2019-09-19 DIAGNOSIS — R1310 Dysphagia, unspecified: Secondary | ICD-10-CM

## 2019-09-19 DIAGNOSIS — K219 Gastro-esophageal reflux disease without esophagitis: Secondary | ICD-10-CM | POA: Diagnosis not present

## 2019-09-19 MED ORDER — OMEPRAZOLE 40 MG PO CPDR: 40.0000 mg | DELAYED_RELEASE_CAPSULE | Freq: Every day | ORAL | 3 refills | Status: DC

## 2019-09-19 MED ORDER — SODIUM CHLORIDE 0.9 % IV SOLN: 500.0000 mL | Freq: Once | INTRAVENOUS | Status: DC

## 2019-09-19 NOTE — Progress Notes (Signed)
Pt's states no medical or surgical changes since previsit or office visit. 

## 2019-09-19 NOTE — Op Note (Signed)
[facility] Patient Name: Sarah Jacobson Procedure Date: 09/19/2019 3:37 PM MRN: [id number] Endoscopist: Jerene Bears , MD Age: 69 Referring MD:  Date of Birth: 09-27-1950 Gender: Female Account #: [id number] Procedure:                Upper GI endoscopy Indications:              Dysphagia, Gastro-esophageal reflux disease Medicines:                Monitored Anesthesia Care Procedure:                Pre-Anesthesia Assessment:                           - Prior to the procedure, a History and Physical                            was performed, and patient medications and                            allergies were reviewed. The patient's tolerance of                            previous anesthesia was also reviewed. The risks                            and benefits of the procedure and the sedation                            options and risks were discussed with the patient.                            All questions were answered, and informed consent                            was obtained. Prior Anticoagulants: The patient has                            taken no previous anticoagulant or antiplatelet                            agents. ASA Grade Assessment: II - A patient with                            mild systemic disease. After reviewing the risks                            and benefits, the patient was deemed in                            satisfactory condition to undergo the procedure.                           After obtaining informed consent, the endoscope was  passed under direct vision. Throughout the                            procedure, the patient's blood pressure, pulse, and                            oxygen saturations were monitored continuously. The                            Endoscope was introduced through the mouth, and                            advanced to the second part of duodenum. The upper                            GI endoscopy was  somewhat difficult due to unusual                            anatomy at the upper esophageal sphincter, query                            cricopharyngeal bar. The patient tolerated the                            procedure well. Scope In: Scope Out: Findings:                 Introduction of the upper endoscope into the                            proximal esophagus was somewhat difficult due to                            angulation at the upper esophageal sphincter and                            probable occult stricture at the UES. On withdrawal                            of the endoscope while placing guidewire for                            dilation a small rent was observed indicating                            smooth stricture in this area. The mucosa was                            normal in the entire esophagus.                           A non-obstructing and partial Schatzki ring was  found at the gastroesophageal junction. The Z-line                            was slightly irregular and no more than 0.5 cm                            above the GE junction. A guidewire was placed and                            the scope was withdrawn. Dilation was performed                            with a Savary dilator with mild resistance at 48 Fr.                           A few small erosions with no bleeding and no                            stigmata of recent bleeding were found in the                            prepyloric region of the stomach.                           The exam of the stomach was otherwise normal.                           The examined duodenum was normal. Complications:            No immediate complications. Estimated Blood Loss:     Estimated blood loss: none. 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 Normal mucosa was found in the entire                            esophagus.                            - Non-obstructing, partial, Schatzki's ring at the                            GE junction. Esophagus dilated with 16 mm (48 Fr)                            Savary.                           - Mild erosive gastropathy with no bleeding and no                            stigmata of recent bleeding.                           -  Normal examined duodenum.                           - No specimens collected. Recommendation:           - Patient has a contact number available for                            emergencies. The signs and symptoms of potential                            delayed complications were discussed with the                            patient. Return to normal activities tomorrow.                            Written discharge instructions were provided to the                            patient.                           - Resume previous diet.                           - Continue present medications. Continue omeprazole                            20 mg daily.                           - Office followup is recommended if reflux is                            uncontrolled or if you have further issues with                            swallowing pills or solid food. Jerene Bears, MD 09/19/2019 4:28:52 PM This report has been signed electronically.

## 2019-09-19 NOTE — Progress Notes (Signed)
A/ox3, pleased with MAC, report to RN 

## 2019-09-19 NOTE — Patient Instructions (Addendum)
FOLLOW DILATATION DIET GIVEN TO YOU TODAY- due to dilation of your esophagus today    Continue previous medications PLUS INCREASE Omeprazole TO 40 mg daily- TAKE 30 MINUTES BEFORE EAT OR DRINK ANYTHING IN THE AM ,THEN TAKE THYROID MEDICATION AT NIGHT AS YOU WERE PREVIOUSLY DOING  Office follow up recommended if reflux uncontrolled or you have further issues with swallowing pills or solid food    YOU HAD AN ENDOSCOPIC PROCEDURE TODAY AT [facility]:   Refer to the procedure report that was given to you for any specific questions about what was found during the examination.  If the procedure report does not answer your questions, please call your gastroenterologist to clarify.  If you requested that your care partner not be given the details of your procedure findings, then the procedure report has been included in a sealed envelope for you to review at your convenience later.  YOU SHOULD EXPECT: Some feelings of bloating in the abdomen. Passage of more gas than usual.  Walking can help get rid of the air that was put into your GI tract during the procedure and reduce the bloating. If you had a lower endoscopy (such as a colonoscopy or flexible sigmoidoscopy) you may notice spotting of blood in your stool or on the toilet paper. If you underwent a bowel prep for your procedure, you may not have a normal bowel movement for a few days.  Please Note:  You might notice some irritation and congestion in your nose or some drainage.  This is from the oxygen used during your procedure.  There is no need for concern and it should clear up in a day or so.  SYMPTOMS TO REPORT IMMEDIATELY:     Following upper endoscopy (EGD)  Vomiting of blood or coffee ground material  New chest pain or pain under the shoulder blades  Painful or persistently difficult swallowing  New shortness of breath  Fever of 100F or higher  Black, tarry-looking stools  For urgent or emergent issues, a  gastroenterologist can be reached at any hour by calling [phone]. Do not use MyChart messaging for urgent concerns.    DIET:   Drink plenty of fluids but you should avoid alcoholic beverages for 24 hours.FOLLOW DILATATION DIET GIVEN TO YOU TODAY!  ACTIVITY:  You should plan to take it easy for the rest of today and you should NOT DRIVE or use heavy machinery until tomorrow (because of the sedation medicines used during the test).    FOLLOW UP: Our staff will call the number listed on your records 48-72 hours following your procedure to check on you and address any questions or concerns that you may have regarding the information given to you following your procedure. If we do not reach you, we will leave a message.  We will attempt to reach you two times.  During this call, we will ask if you have developed any symptoms of COVID 19. If you develop any symptoms (ie: fever, flu-like symptoms, shortness of breath, cough etc.) before then, please call [phone].  If you test positive for Covid 19 in the 2 weeks post procedure, please call and report this information to [place].    If any biopsies were taken you will be contacted by phone or by letter within the next 1-3 weeks.  Please call us at [phone] if you have not heard about the biopsies in 3 weeks.    SIGNATURES/CONFIDENTIALITY: You and/or your care partner have signed  paperwork which will be entered into your electronic medical record.  These signatures attest to the fact that that the information above on your After Visit Summary has been reviewed and is understood.  Full responsibility of the confidentiality of this discharge information lies with you and/or your care-partner.

## 2019-09-19 NOTE — Telephone Encounter (Signed)
Note being used as documentation post upper endoscopy today  Patient had upper endoscopy for GERD, dysphagia and epigastric discomfort. 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 Post procedure the patient reports sore throat.  Coughing was noted immediately following the procedure but this has improved.  She is tolerating water post procedure but her throat is sore with swallowing.  No dyspnea or chest pain.  Exam shows no crepitus and she well-appearing.  Vital signs normal.  Decision made to increase omeprazole to 40 mg daily from 20 mg daily.  She is having postprandial epigastric type discomfort particularly with raw fruits and vegetables.  This can also occur with fruit juices. I asked that she take the higher dose omeprazole for 1 month and then let me know by phone call or MyChart message if symptoms have improved.  If not consider evaluation with abdominal ultrasound to rule out gallbladder disease. Finally, she will follow post dilation diet.  She can use Tylenol 650 to 1000 mg every 6-8 hours for sore throat.  Chloraseptic over-the-counter if needed for sore throat.  If epigastric symptoms persist she is instructed to let me know within the next 4 to 6 weeks.  If further issues with dysphagia then I would recommend we evaluate with barium esophagram as the next diagnostic test if symptoms warrant

## 2019-09-21 ENCOUNTER — Telehealth: Payer: Self-pay | Admitting: *Deleted

## 2019-09-21 ENCOUNTER — Telehealth: Payer: Self-pay

## 2019-09-21 NOTE — Telephone Encounter (Signed)
First attempt follow up call to pt, lm on vm 

## 2019-09-21 NOTE — Telephone Encounter (Signed)
Second attempt, left VM.  

## 2019-09-26 DIAGNOSIS — M9901 Segmental and somatic dysfunction of cervical region: Secondary | ICD-10-CM | POA: Diagnosis not present

## 2019-09-26 DIAGNOSIS — R519 Headache, unspecified: Secondary | ICD-10-CM | POA: Diagnosis not present

## 2019-09-26 DIAGNOSIS — M5416 Radiculopathy, lumbar region: Secondary | ICD-10-CM | POA: Diagnosis not present

## 2019-09-26 DIAGNOSIS — M9903 Segmental and somatic dysfunction of lumbar region: Secondary | ICD-10-CM | POA: Diagnosis not present

## 2019-09-27 DIAGNOSIS — M9903 Segmental and somatic dysfunction of lumbar region: Secondary | ICD-10-CM | POA: Diagnosis not present

## 2019-09-27 DIAGNOSIS — M9901 Segmental and somatic dysfunction of cervical region: Secondary | ICD-10-CM | POA: Diagnosis not present

## 2019-09-27 DIAGNOSIS — R519 Headache, unspecified: Secondary | ICD-10-CM | POA: Diagnosis not present

## 2019-09-27 DIAGNOSIS — M5416 Radiculopathy, lumbar region: Secondary | ICD-10-CM | POA: Diagnosis not present

## 2019-09-29 DIAGNOSIS — M9903 Segmental and somatic dysfunction of lumbar region: Secondary | ICD-10-CM | POA: Diagnosis not present

## 2019-09-29 DIAGNOSIS — M5416 Radiculopathy, lumbar region: Secondary | ICD-10-CM | POA: Diagnosis not present

## 2019-09-29 DIAGNOSIS — R519 Headache, unspecified: Secondary | ICD-10-CM | POA: Diagnosis not present

## 2019-09-29 DIAGNOSIS — M9901 Segmental and somatic dysfunction of cervical region: Secondary | ICD-10-CM | POA: Diagnosis not present

## 2019-10-30 ENCOUNTER — Other Ambulatory Visit: Payer: Self-pay | Admitting: Internal Medicine

## 2019-11-20 ENCOUNTER — Encounter: Payer: Self-pay | Admitting: Internal Medicine

## 2019-11-22 ENCOUNTER — Encounter: Payer: Self-pay | Admitting: Internal Medicine

## 2019-11-22 ENCOUNTER — Ambulatory Visit (INDEPENDENT_AMBULATORY_CARE_PROVIDER_SITE_OTHER): Payer: Medicare Other | Admitting: Internal Medicine

## 2019-11-22 ENCOUNTER — Other Ambulatory Visit: Payer: Self-pay

## 2019-11-22 VITALS — BP 122/78 | HR 71

## 2019-11-22 DIAGNOSIS — F411 Generalized anxiety disorder: Secondary | ICD-10-CM | POA: Diagnosis not present

## 2019-11-22 MED ORDER — CITALOPRAM HYDROBROMIDE 10 MG PO TABS
10.0000 mg | ORAL_TABLET | Freq: Every day | ORAL | 2 refills | Status: DC
Start: 2019-11-22 — End: 2019-12-15

## 2019-11-22 NOTE — Progress Notes (Signed)
Subjective:    Patient ID: Sarah Jacobson, female    DOB: 1950/09/03, 69 y.o.   MRN: [id number]  HPI  Patient presents to the clinic today with complaint of stress. She started noticing increasing symptoms over the last few weeks. She feels tearful at times. She sleeps fairly well, reads until she falls to sleep. She feels like her appetite is good, she has been actively been trying to lose weight. She tries to avoid alcohol intake at night, but just trying to monitor her weight. She is not currently seeing a therapist. She has a history of anxiety, currently managed without medications.  Reports she has taken Citalopram in the past with good effect.  She reports she did not have any side effects with this medication but weaned herself off when she felt like it was no longer needed. She denies SI/HI.  Review of Systems      Past Medical History:  Diagnosis Date  . Anxiety   . Complication of anesthesia   . COVID-19   . External hemorrhoids   . GERD (gastroesophageal reflux disease)   . Hyperlipidemia   . Hypothyroidism   . Internal hemorrhoids   . PONV (postoperative nausea and vomiting)   . Thyroid disease     Current Outpatient Medications  Medication Sig Dispense Refill  . Calcium Carb-Cholecalciferol (CALCIUM 600 + D PO) Take 1 tablet by mouth daily.    . Cholecalciferol (VITAMIN D) 2000 UNITS tablet Take 2,000 Units by mouth daily.    . Cyanocobalamin (VITAMIN B 12 PO) Take 1 tablet by mouth daily.    Marland Kitchen levothyroxine (SYNTHROID) 75 MCG tablet TAKE 1 TABLET DAILY BEFORE BREAKFAST 90 tablet 1  . meloxicam (MOBIC) 15 MG tablet Take 1 tablet (15 mg total) by mouth daily. 90 tablet 3  . Misc Natural Products (COSAMIN ASU ADVANCED FORMULA PO) Take 1 tablet by mouth daily.    . Multiple Vitamin (MULTI VITAMIN DAILY PO) Take 1 tablet by mouth daily.    Marland Kitchen omeprazole (PRILOSEC) 40 MG capsule Take 1 capsule (40 mg total) by mouth daily. 90 capsule 3  . Probiotic Product (PROBIOTIC  DAILY) CAPS Take 1 capsule by mouth daily.    Marland Kitchen Specialty Vitamins Products (MAGNESIUM, AMINO ACID CHELATE,) 133 MG tablet Take 1 tablet by mouth daily.     No current facility-administered medications for this visit.    Allergies  Allergen Reactions  . Sulfa Antibiotics Hives  . Codeine Nausea And Vomiting  . Penicillins Hives    Family History  Problem Relation Age of Onset  . Lung cancer Father        smoker  . Breast cancer Neg Hx     Social History   Socioeconomic History  . Marital status: Married    Spouse name: Not on file  . Number of children: Not on file  . Years of education: Not on file  . Highest education level: Not on file  Occupational History  . Not on file  Tobacco Use  . Smoking status: Never Smoker  . Smokeless tobacco: Never Used  Substance and Sexual Activity  . Alcohol use: Yes    Alcohol/week: 1.0 standard drink    Types: 1 Glasses of wine per week  . Drug use: No  . Sexual activity: Yes    Partners: Male    Comment: Husband  Other Topics Concern  . Not on file  Social History Narrative   Lives with husband (13 years of marriage)  Children-3    No Pets   Right handed    Caffeine- 2-3 cups coffee, tea occasionally    Enjoys being with the grandchildren    Social Determinants of Health   Financial Resource Strain:   . Difficulty of Paying Living Expenses:   Food Insecurity:   . Worried About Charity fundraiser in the Last Year:   . Arboriculturist in the Last Year:   Transportation Needs:   . Film/video editor (Medical):   Marland Kitchen Lack of Transportation (Non-Medical):   Physical Activity:   . Days of Exercise per Week:   . Minutes of Exercise per Session:   Stress:   . Feeling of Stress :   Social Connections:   . Frequency of Communication with Friends and Family:   . Frequency of Social Gatherings with Friends and Family:   . Attends Religious Services:   . Active Member of Clubs or Organizations:   . Attends Theatre manager Meetings:   Marland Kitchen Marital Status:   Intimate Partner Violence:   . Fear of Current or Ex-Partner:   . Emotionally Abused:   Marland Kitchen Physically Abused:   . Sexually Abused:      Constitutional: Denies fever, malaise, fatigue, headache or abrupt weight changes.  Respiratory: Denies difficulty breathing, shortness of breath, cough or sputum production.   Cardiovascular: Denies chest pain, chest tightness, palpitations or swelling in the hands or feet.  Neurological: Denies dizziness, difficulty with memory, difficulty with speech or problems with balance and coordination.  Psych: Pt reports stress, anxiety. Denies depression, SI/HI.  No other specific complaints in a complete review of systems (except as listed in HPI above).  Objective:   Physical Exam   BP 122/78   Pulse 71   Wt Readings from Last 3 Encounters:  09/19/19 196 lb (88.9 kg)  09/06/19 196 lb (88.9 kg)  07/07/19 197 lb (89.4 kg)    General: Appears her stated age, well developed, well nourished in NAD. Cardiovascular: Normal rate Pulmonary/Chest: Normal effort Neurological: Alert and oriented. Psychiatric: Mood and affect mildly anxious appearing. Behavior is normal. Judgment and thought content normal.     BMET    Component Value Date/Time   NA 139 07/07/2019 1231   NA 140 12/05/2013 0000   K 4.6 07/07/2019 1231   CL 100 07/07/2019 1231   CO2 31 07/07/2019 1231   GLUCOSE 101 (H) 07/07/2019 1231   BUN 19 07/07/2019 1231   BUN 16 12/05/2013 0000   CREATININE 1.05 07/07/2019 1231   CALCIUM 10.1 07/07/2019 1231    Lipid Panel     Component Value Date/Time   CHOL 255 (H) 07/07/2019 1231   TRIG 94.0 07/07/2019 1231   HDL 77.80 07/07/2019 1231   CHOLHDL 3 07/07/2019 1231   VLDL 18.8 07/07/2019 1231   LDLCALC 159 (H) 07/07/2019 1231    CBC    Component Value Date/Time   WBC 5.5 04/29/2018 1132   RBC 4.58 04/29/2018 1132   HGB 13.8 04/29/2018 1132   HCT 41.3 04/29/2018 1132   PLT 226.0  04/29/2018 1132   MCV 90.2 04/29/2018 1132   MCHC 33.3 04/29/2018 1132   RDW 13.5 04/29/2018 1132   LYMPHSABS 1.7 12/20/2014 1700   MONOABS 0.4 12/20/2014 1700   EOSABS 0.2 12/20/2014 1700   BASOSABS 0.0 12/20/2014 1700    Hgb A1C Lab Results  Component Value Date   HGBA1C 5.7 02/01/2016  Assessment & Plan:     Webb Silversmith, NP This visit occurred during the SARS-CoV-2 public health emergency.  Safety protocols were in place, including screening questions prior to the visit, additional usage of staff PPE, and extensive cleaning of exam room while observing appropriate contact time as indicated for disinfecting solutions.

## 2019-11-22 NOTE — Patient Instructions (Signed)

## 2019-11-22 NOTE — Assessment & Plan Note (Signed)
Deteriorated Support offered today We will restart Citalopram 10 mg one tab p.o. daily, Rx sent to pharmacy Referral to psychology, Marya Fossa, for CBT  Update me in 3 weeks and let me know how you are doing

## 2019-11-23 NOTE — Telephone Encounter (Signed)
This referral was placed yesterday. Is there another female available?

## 2019-11-28 NOTE — Telephone Encounter (Signed)
Spoke w/[facility], [phone].  Trey Paula is a female provider.  She is booked out until end of Sep 2021 and they have a new female provider who is booking appts in mid Sep.  Gave pt their phone# and she will call to r/s.

## 2019-12-14 ENCOUNTER — Other Ambulatory Visit: Payer: Self-pay | Admitting: Internal Medicine

## 2019-12-26 DIAGNOSIS — M9903 Segmental and somatic dysfunction of lumbar region: Secondary | ICD-10-CM | POA: Diagnosis not present

## 2019-12-26 DIAGNOSIS — M5416 Radiculopathy, lumbar region: Secondary | ICD-10-CM | POA: Diagnosis not present

## 2019-12-26 DIAGNOSIS — R519 Headache, unspecified: Secondary | ICD-10-CM | POA: Diagnosis not present

## 2019-12-26 DIAGNOSIS — M9901 Segmental and somatic dysfunction of cervical region: Secondary | ICD-10-CM | POA: Diagnosis not present

## 2019-12-27 ENCOUNTER — Ambulatory Visit: Payer: Medicare Other | Admitting: Psychology

## 2019-12-28 DIAGNOSIS — M9901 Segmental and somatic dysfunction of cervical region: Secondary | ICD-10-CM | POA: Diagnosis not present

## 2019-12-28 DIAGNOSIS — M9903 Segmental and somatic dysfunction of lumbar region: Secondary | ICD-10-CM | POA: Diagnosis not present

## 2019-12-28 DIAGNOSIS — R519 Headache, unspecified: Secondary | ICD-10-CM | POA: Diagnosis not present

## 2019-12-28 DIAGNOSIS — M5416 Radiculopathy, lumbar region: Secondary | ICD-10-CM | POA: Diagnosis not present

## 2019-12-30 DIAGNOSIS — M9903 Segmental and somatic dysfunction of lumbar region: Secondary | ICD-10-CM | POA: Diagnosis not present

## 2019-12-30 DIAGNOSIS — M9901 Segmental and somatic dysfunction of cervical region: Secondary | ICD-10-CM | POA: Diagnosis not present

## 2019-12-30 DIAGNOSIS — M5416 Radiculopathy, lumbar region: Secondary | ICD-10-CM | POA: Diagnosis not present

## 2019-12-30 DIAGNOSIS — R519 Headache, unspecified: Secondary | ICD-10-CM | POA: Diagnosis not present

## 2020-01-02 DIAGNOSIS — R519 Headache, unspecified: Secondary | ICD-10-CM | POA: Diagnosis not present

## 2020-01-02 DIAGNOSIS — M9901 Segmental and somatic dysfunction of cervical region: Secondary | ICD-10-CM | POA: Diagnosis not present

## 2020-01-02 DIAGNOSIS — M5416 Radiculopathy, lumbar region: Secondary | ICD-10-CM | POA: Diagnosis not present

## 2020-01-02 DIAGNOSIS — M9903 Segmental and somatic dysfunction of lumbar region: Secondary | ICD-10-CM | POA: Diagnosis not present

## 2020-01-12 DIAGNOSIS — M9901 Segmental and somatic dysfunction of cervical region: Secondary | ICD-10-CM | POA: Diagnosis not present

## 2020-01-12 DIAGNOSIS — M5416 Radiculopathy, lumbar region: Secondary | ICD-10-CM | POA: Diagnosis not present

## 2020-01-12 DIAGNOSIS — R519 Headache, unspecified: Secondary | ICD-10-CM | POA: Diagnosis not present

## 2020-01-12 DIAGNOSIS — M9903 Segmental and somatic dysfunction of lumbar region: Secondary | ICD-10-CM | POA: Diagnosis not present

## 2020-01-23 ENCOUNTER — Encounter: Payer: Self-pay | Admitting: Internal Medicine

## 2020-01-23 MED ORDER — ACYCLOVIR 5 % EX OINT
1.0000 | TOPICAL_OINTMENT | Freq: Three times a day (TID) | CUTANEOUS | 0 refills | Status: DC
Start: 2020-01-23 — End: 2020-01-24

## 2020-01-23 MED ORDER — VALACYCLOVIR HCL 1 G PO TABS
1000.0000 mg | ORAL_TABLET | Freq: Every day | ORAL | 0 refills | Status: DC
Start: 2020-01-23 — End: 2020-02-16

## 2020-01-24 MED ORDER — ACYCLOVIR 5 % EX OINT: 1.0000 "application " | TOPICAL_OINTMENT | Freq: Three times a day (TID) | CUTANEOUS | 0 refills | Status: AC

## 2020-01-24 NOTE — Addendum Note (Signed)
Addended by: Carter Kitten on: 01/24/2020 04:54 PM   Modules accepted: Orders

## 2020-01-25 DIAGNOSIS — M9903 Segmental and somatic dysfunction of lumbar region: Secondary | ICD-10-CM | POA: Diagnosis not present

## 2020-01-25 DIAGNOSIS — R519 Headache, unspecified: Secondary | ICD-10-CM | POA: Diagnosis not present

## 2020-01-25 DIAGNOSIS — M5416 Radiculopathy, lumbar region: Secondary | ICD-10-CM | POA: Diagnosis not present

## 2020-01-25 DIAGNOSIS — M9901 Segmental and somatic dysfunction of cervical region: Secondary | ICD-10-CM | POA: Diagnosis not present

## 2020-01-27 DIAGNOSIS — M9901 Segmental and somatic dysfunction of cervical region: Secondary | ICD-10-CM | POA: Diagnosis not present

## 2020-01-27 DIAGNOSIS — R519 Headache, unspecified: Secondary | ICD-10-CM | POA: Diagnosis not present

## 2020-01-27 DIAGNOSIS — M5416 Radiculopathy, lumbar region: Secondary | ICD-10-CM | POA: Diagnosis not present

## 2020-01-27 DIAGNOSIS — M9903 Segmental and somatic dysfunction of lumbar region: Secondary | ICD-10-CM | POA: Diagnosis not present

## 2020-02-03 DIAGNOSIS — M5412 Radiculopathy, cervical region: Secondary | ICD-10-CM | POA: Diagnosis not present

## 2020-02-03 DIAGNOSIS — M4722 Other spondylosis with radiculopathy, cervical region: Secondary | ICD-10-CM | POA: Diagnosis not present

## 2020-02-03 DIAGNOSIS — M2578 Osteophyte, vertebrae: Secondary | ICD-10-CM | POA: Diagnosis not present

## 2020-02-03 DIAGNOSIS — M50123 Cervical disc disorder at C6-C7 level with radiculopathy: Secondary | ICD-10-CM | POA: Diagnosis not present

## 2020-02-03 DIAGNOSIS — M4312 Spondylolisthesis, cervical region: Secondary | ICD-10-CM | POA: Diagnosis not present

## 2020-02-06 ENCOUNTER — Other Ambulatory Visit: Payer: Self-pay | Admitting: Nurse Practitioner

## 2020-02-06 DIAGNOSIS — M5412 Radiculopathy, cervical region: Secondary | ICD-10-CM

## 2020-02-09 ENCOUNTER — Other Ambulatory Visit: Payer: Self-pay

## 2020-02-09 ENCOUNTER — Ambulatory Visit
Admission: RE | Admit: 2020-02-09 | Discharge: 2020-02-09 | Disposition: A | Discharge status: Home / Self Care | Payer: Medicare Other | Source: Ambulatory Visit | Attending: Nurse Practitioner | Admitting: Nurse Practitioner

## 2020-02-09 DIAGNOSIS — M4802 Spinal stenosis, cervical region: Secondary | ICD-10-CM | POA: Diagnosis not present

## 2020-02-09 DIAGNOSIS — M5412 Radiculopathy, cervical region: Secondary | ICD-10-CM | POA: Insufficient documentation

## 2020-02-09 DIAGNOSIS — M5023 Other cervical disc displacement, cervicothoracic region: Secondary | ICD-10-CM | POA: Diagnosis not present

## 2020-02-09 NOTE — Telephone Encounter (Signed)
Ok to refer to [facility] for thrombosed external hemorrhoid

## 2020-02-10 DIAGNOSIS — M4802 Spinal stenosis, cervical region: Secondary | ICD-10-CM | POA: Diagnosis not present

## 2020-02-10 DIAGNOSIS — M5412 Radiculopathy, cervical region: Secondary | ICD-10-CM | POA: Diagnosis not present

## 2020-02-14 ENCOUNTER — Other Ambulatory Visit: Payer: Self-pay | Admitting: Internal Medicine

## 2020-02-21 ENCOUNTER — Encounter: Payer: Self-pay | Admitting: Internal Medicine

## 2020-02-22 ENCOUNTER — Other Ambulatory Visit: Payer: Self-pay | Admitting: Internal Medicine

## 2020-02-23 DIAGNOSIS — M5013 Cervical disc disorder with radiculopathy, cervicothoracic region: Secondary | ICD-10-CM | POA: Diagnosis not present

## 2020-02-28 DIAGNOSIS — M5013 Cervical disc disorder with radiculopathy, cervicothoracic region: Secondary | ICD-10-CM | POA: Diagnosis not present

## 2020-03-01 DIAGNOSIS — M5013 Cervical disc disorder with radiculopathy, cervicothoracic region: Secondary | ICD-10-CM | POA: Diagnosis not present

## 2020-03-06 DIAGNOSIS — M5013 Cervical disc disorder with radiculopathy, cervicothoracic region: Secondary | ICD-10-CM | POA: Diagnosis not present

## 2020-03-08 DIAGNOSIS — M5412 Radiculopathy, cervical region: Secondary | ICD-10-CM | POA: Diagnosis not present

## 2020-03-12 ENCOUNTER — Other Ambulatory Visit: Payer: Self-pay | Admitting: Neurosurgery

## 2020-03-14 ENCOUNTER — Ambulatory Visit (INDEPENDENT_AMBULATORY_CARE_PROVIDER_SITE_OTHER): Payer: Medicare Other

## 2020-03-14 DIAGNOSIS — Z23 Encounter for immunization: Secondary | ICD-10-CM

## 2020-03-27 ENCOUNTER — Inpatient Hospital Stay: Admission: RE | Admit: 2020-03-27 | Payer: Medicare Other | Source: Ambulatory Visit

## 2020-04-02 ENCOUNTER — Other Ambulatory Visit: Payer: Medicare Other

## 2020-04-04 ENCOUNTER — Ambulatory Visit: Admission: RE | Admit: 2020-04-04 | Payer: Medicare Other | Source: Home / Self Care | Admitting: Neurosurgery

## 2020-04-04 ENCOUNTER — Encounter: Admission: RE | Payer: Self-pay | Source: Home / Self Care

## 2020-04-04 SURGERY — ANTERIOR CERVICAL DECOMPRESSION/DISCECTOMY FUSION 2 LEVELS
Anesthesia: General

## 2020-04-24 ENCOUNTER — Other Ambulatory Visit: Payer: Self-pay

## 2020-04-24 ENCOUNTER — Ambulatory Visit
Admission: EM | Admit: 2020-04-24 | Discharge: 2020-04-24 | Disposition: A | Discharge status: Home / Self Care | Payer: Medicare Other | Attending: Emergency Medicine | Admitting: Emergency Medicine

## 2020-04-24 ENCOUNTER — Encounter: Payer: Self-pay | Admitting: Internal Medicine

## 2020-04-24 ENCOUNTER — Encounter: Payer: Self-pay | Admitting: Emergency Medicine

## 2020-04-24 DIAGNOSIS — R059 Cough, unspecified: Secondary | ICD-10-CM | POA: Insufficient documentation

## 2020-04-24 DIAGNOSIS — J0140 Acute pansinusitis, unspecified: Secondary | ICD-10-CM | POA: Diagnosis not present

## 2020-04-24 DIAGNOSIS — Z20822 Contact with and (suspected) exposure to covid-19: Secondary | ICD-10-CM | POA: Insufficient documentation

## 2020-04-24 LAB — RESP PANEL BY RT-PCR (FLU A&B, COVID) ARPGX2
Influenza A by PCR: NEGATIVE
Influenza B by PCR: NEGATIVE
SARS Coronavirus 2 by RT PCR: NEGATIVE

## 2020-04-24 MED ORDER — DOXYCYCLINE HYCLATE 100 MG PO CAPS: 100.0000 mg | ORAL_CAPSULE | Freq: Two times a day (BID) | ORAL | 0 refills | Status: AC

## 2020-04-24 NOTE — Discharge Instructions (Addendum)
Recommend start Doxycycline 100mg  twice a day as directed. May continue OTC Sudafed and Mucinex DM as directed for cough and congestion. May continue Ibuprofen 600mg  every 8 hours as needed for headache and pain. Continue to push fluids to help loosen up mucus in chest and sinuses. Follow-up in 4 to 5 days if not improving.

## 2020-04-24 NOTE — ED Triage Notes (Signed)
Patient c/o cough, nasal congestion, headache that started Saturday. Denies fever.

## 2020-04-24 NOTE — Telephone Encounter (Signed)
[facility] - Client TELEPHONE ADVICE RECORD [facility] Patient Name: Sarah Jacobson Gender: Female DOB: 1950/09/11 Age: 69 Y 2 M 9 D Return Phone Number: [phone] (Primary) Address: City/State/Zip: [place] [address] Client [facility] - Client Client Site [facility] - Day Physician Webb Silversmith - NP Contact Type Call Who Is Calling Patient / Member / Family / Caregiver Call Type Triage / Clinical Relationship To Patient Self Return Phone Number [phone] (Primary) Chief Complaint BREATHING - shortness of breath or sounds breathless Reason for Call Symptomatic / Request for [place] states, pt has congestion, sore throat, runny nose, headache, and swollen glands. Pt did mention sob. Translation No Nurse Assessment Nurse: Rock Nephew, RN, Juliann Pulse Date/Time (Eastern Time): 04/24/2020 9:25:16 AM Confirm and document reason for call. If symptomatic, describe symptoms. ---Caller stated she has congestion, sore throat, runny nose, headache, and swollen glands. Pt did mention SOB . Does the patient have any new or worsening symptoms? ---Yes Will a triage be completed? ---Yes Related visit to physician within the last 2 weeks? ---No Does the PT have any chronic conditions? (i.e. diabetes, asthma, this includes High risk factors for pregnancy, etc.) ---No Is this a behavioral health or substance abuse call? ---No Guidelines Guideline Title Affirmed Question Affirmed Notes Nurse Date/Time Eilene Ghazi Time) Cough - Acute Productive Chest pain (Exception: MILD central chest pain, present only when coughing) Rock Nephew, RN, Juliann Pulse 04/24/2020 9:26:18 AM Disp. Time Eilene Ghazi Time) Disposition Final User 04/24/2020 9:21:48 AM Send to Urgent Queue Lonia Farber 04/24/2020 9:30:59 AM Go to ED Now Yes Rock Nephew, RN, Gara Kroner Disagree/Comply Comply PLEASE NOTE: All timestamps contained within  this report are represented as [place] Standard Time. CONFIDENTIALTY NOTICE: This fax transmission is intended only for the addressee. It contains information that is legally privileged, confidential or otherwise protected from use or disclosure. If you are not the intended recipient, you are strictly prohibited from reviewing, disclosing, copying using or disseminating any of this information or taking any action in reliance on or regarding this information. If you have received this fax in error, please notify us immediately by telephone so that we can arrange for its return to [place]. Phone: [phone], Toll-Free: [phone], Fax: [phone] Page: 2 of 2 Call Id: [id number] Albert Understands Yes PreDisposition Call Doctor Care Advice Given Per Guideline GO TO ED NOW: * You need to be seen in the Emergency Department. ANOTHER ADULT SHOULD DRIVE: * It is better and safer if another adult drives instead of you. * Bring a list of your current medicines when you go to the Emergency Department (ER). CARE ADVICE given per Cough - Acute Productive (Adult) guideline. Referrals GO TO FACILITY UNDECIDED

## 2020-04-24 NOTE — ED Provider Notes (Signed)
[facility]    CSN: [id number] Arrival date & time: 04/24/20  1144      History   Chief Complaint Chief Complaint  Patient presents with  . Cough  . Nasal Congestion       . Headache    HPI Sarah Jacobson is a 69 y.o. female.   69 year old female presents with nasal congestion, cough, headache and swollen glands for the past 4 to 5 days. Having more sinus pressure, nausea, decreased appetite over the past 2 days. Also felt warm and has bilateral ear pain. Denies any distinct fever, vomiting or diarrhea. Has taken Mucinex DM, Advil, Sudafed and OTC antihistamine with minimal relief. No known exposure to COVID 19. Husband was sick with similar symptoms last week and dx with sinus infection- feeling better now. Has been vaccinated against COVID 19. Has history of positive COVID 19 infection last year and feels "worse" with this illness. Other chronic health issues include thyroid disorder, GERD, hyperlipidemia and anxiety. Currently on Synthroid, Prilosec, Celexa and multiple supplements daily.   The history is provided by the patient.    Past Medical History:  Diagnosis Date  . Anxiety   . Complication of anesthesia   . COVID-19   . External hemorrhoids   . GERD (gastroesophageal reflux disease)   . Hyperlipidemia   . Hypothyroidism   . Internal hemorrhoids   . PONV (postoperative nausea and vomiting)   . Thyroid disease     Patient Active Problem List   Diagnosis Date Noted  . GERD (gastroesophageal reflux disease) 07/07/2019  . Hyperlipidemia 10/04/2015  . Generalized anxiety disorder 10/13/2014  . Hypothyroidism 10/13/2014    Past Surgical History:  Procedure Laterality Date  . ABDOMINAL HYSTERECTOMY  1982/2000   uterus/ovaries  . CATARACT EXTRACTION W/PHACO Right 07/30/2016   Procedure: CATARACT EXTRACTION PHACO AND INTRAOCULAR LENS PLACEMENT (IOC);  Surgeon: Estill Cotta, MD;  Location: [facility] ORS;  Service: Ophthalmology;  Laterality: Right;   [place] 02:01 AP% 26.3 CDE 58.46 fluid pack lot # [id number]  . ROTATOR CUFF REPAIR  1986   Both shoulders  . TONSILLECTOMY AND ADENOIDECTOMY  1992    OB History   No obstetric history on file.      Home Medications    Prior to Admission medications   Medication Sig Start Date End Date Taking? Authorizing Provider  acyclovir ointment (ZOVIRAX) 5 % Apply 1 application topically in the morning, at noon, and at bedtime. 01/24/20  Yes Jearld Fenton, NP  b complex vitamins tablet Take 1 tablet by mouth daily.   Yes [provider]  Calcium Carb-Cholecalciferol (CALCIUM 600 + D PO) Take 1 tablet by mouth daily.   Yes [provider]  Cholecalciferol (VITAMIN D) 2000 UNITS tablet Take 2,000 Units by mouth daily.   Yes [provider]  citalopram (CELEXA) 10 MG tablet TAKE 1 TABLET BY MOUTH EVERY DAY 12/15/19  Yes Jearld Fenton, NP  levothyroxine (SYNTHROID) 75 MCG tablet TAKE 1 TABLET DAILY BEFORE BREAKFAST 10/31/19  Yes Baity, Coralie Keens, NP  Misc Natural Products (COSAMIN ASU ADVANCED FORMULA PO) Take 1 tablet by mouth daily.   Yes [provider]  Multiple Vitamin (MULTI VITAMIN DAILY PO) Take 1 tablet by mouth daily.   Yes [provider]  omeprazole (PRILOSEC) 40 MG capsule Take 1 capsule (40 mg total) by mouth daily. 09/19/19  Yes Pyrtle, Lajuan Lines, MD  Probiotic Product (PROBIOTIC DAILY) CAPS Take 1 capsule by mouth daily.  Yes [provider]  Specialty Vitamins Products (MAGNESIUM, AMINO ACID CHELATE,) 133 MG tablet Take 1 tablet by mouth daily.   Yes [provider]  valACYclovir (VALTREX) 1000 MG tablet TAKE 1 TABLET (1,000 MG TOTAL) BY MOUTH DAILY. 02/23/20  Yes Baity, Coralie Keens, NP  doxycycline (VIBRAMYCIN) 100 MG capsule Take 1 capsule (100 mg total) by mouth 2 (two) times daily for 7 days. 04/24/20 05/01/20  Katy Apo, NP    Family History Family History  Problem Relation Age of Onset  . Lung cancer Father         smoker  . Breast cancer Neg Hx     Social History Social History   Tobacco Use  . Smoking status: Never Smoker  . Smokeless tobacco: Never Used  Substance Use Topics  . Alcohol use: Yes    Alcohol/week: 1.0 standard drink    Types: 1 Glasses of wine per week  . Drug use: No     Allergies   Sulfa antibiotics, Codeine, and Penicillins   Review of Systems Review of Systems  Constitutional: Positive for appetite change and fatigue. Negative for activity change and fever (but felt warm).  HENT: Positive for congestion, ear pain, postnasal drip, sinus pressure, sinus pain, sneezing, sore throat and trouble swallowing. Negative for ear discharge, facial swelling, mouth sores and nosebleeds.   Eyes: Negative for pain, discharge, redness and itching.  Respiratory: Positive for cough. Negative for chest tightness, shortness of breath and wheezing.   Gastrointestinal: Positive for nausea. Negative for diarrhea and vomiting.  Musculoskeletal: Positive for arthralgias and myalgias. Negative for neck pain and neck stiffness.  Skin: Negative for color change and rash.  Allergic/Immunologic: Negative for environmental allergies, food allergies and immunocompromised state.  Neurological: Positive for headaches. Negative for dizziness, tremors, seizures, syncope, weakness and numbness.  Hematological: Positive for adenopathy. Does not bruise/bleed easily.     Physical Exam Triage Vital Signs ED Triage Vitals  Enc Vitals Group     BP 04/24/20 1235 107/75     Pulse Rate 04/24/20 1235 98     Resp 04/24/20 1235 18     Temp 04/24/20 1235 98 F (36.7 C)     Temp Source 04/24/20 1235 Oral     SpO2 04/24/20 1235 98 %     Weight 04/24/20 1232 185 lb (83.9 kg)     Height 04/24/20 1232 5\' 7"  (1.702 m)     Head Circumference --      Peak Flow --      Pain Score 04/24/20 1232 2     Pain Loc --      Pain Edu? --      Excl. in [place]? --    No data found.  Updated Vital Signs BP 107/75 (BP  Location: Right Arm)   Pulse 98   Temp 98 F (36.7 C) (Oral)   Resp 18   Ht 5\' 7"  (1.702 m)   Wt 185 lb (83.9 kg)   SpO2 98%   BMI 28.98 kg/m   Visual Acuity Right Eye Distance:   Left Eye Distance:   Bilateral Distance:    Right Eye Near:   Left Eye Near:    Bilateral Near:     Physical Exam Vitals and nursing note reviewed.  Constitutional:      General: She is awake. She is not in acute distress.    Appearance: She is well-developed and well-groomed. She is ill-appearing.     Comments: She is sitting comfortably  on the exam table in no acute distress but appears tired and ill.   HENT:     Head: Normocephalic and atraumatic.     Right Ear: Hearing, ear canal and external ear normal. Tympanic membrane is bulging. Tympanic membrane is not injected or erythematous.     Left Ear: Hearing, ear canal and external ear normal. Tympanic membrane is bulging. Tympanic membrane is not injected or erythematous.     Nose: Mucosal edema and congestion present. No rhinorrhea.     Right Sinus: Maxillary sinus tenderness and frontal sinus tenderness present.     Left Sinus: Maxillary sinus tenderness and frontal sinus tenderness present.     Mouth/Throat:     Lips: Pink.     Mouth: Mucous membranes are moist.     Pharynx: Uvula midline. Posterior oropharyngeal erythema present. No pharyngeal swelling, oropharyngeal exudate or uvula swelling.  Eyes:     Extraocular Movements: Extraocular movements intact.     Conjunctiva/sclera: Conjunctivae normal.  Cardiovascular:     Rate and Rhythm: Normal rate and regular rhythm.     Heart sounds: Normal heart sounds. No murmur heard.   Pulmonary:     Effort: Pulmonary effort is normal. No respiratory distress.     Breath sounds: Normal air entry. No decreased air movement. Examination of the right-upper field reveals rhonchi. Examination of the left-upper field reveals rhonchi. Rhonchi present. No decreased breath sounds, wheezing or rales.      Comments: Coarse breath sounds in the upper lobes when coughing.  Musculoskeletal:     Cervical back: Normal range of motion and neck supple. Tenderness present.  Lymphadenopathy:     Head:     Right side of head: Tonsillar adenopathy present.     Left side of head: Tonsillar adenopathy present.     Cervical: Cervical adenopathy present.     Right cervical: Superficial cervical adenopathy present.     Left cervical: Superficial cervical adenopathy present.  Skin:    General: Skin is warm and dry.     Capillary Refill: Capillary refill takes less than 2 seconds.     Findings: No rash.  Neurological:     General: No focal deficit present.     Mental Status: She is alert and oriented to person, place, and time.  Psychiatric:        Mood and Affect: Mood normal.        Behavior: Behavior normal. Behavior is cooperative.        Thought Content: Thought content normal.        Judgment: Judgment normal.      UC Treatments / Results  Labs (all labs ordered are listed, but only abnormal results are displayed) Labs Reviewed  RESP PANEL BY RT-PCR (FLU A&B, COVID) [id number]    EKG   Radiology No results found.  Procedures Procedures (including critical care time)  Medications Ordered in UC Medications - No data to display  Initial Impression / Assessment and Plan / UC Course  I have reviewed the triage vital signs and the nursing notes.  Pertinent labs & imaging results that were available during my care of the patient were reviewed by me and considered in my medical decision making (see chart for details).    Reviewed negative rapid COVID 19 test and Influenza test with patient. Discussed with patient that she has an early sinus infection. Due to history of recurrent sinus infections in the past and worsening of symptoms, will start Doxycycline 100mg  twice a day  as directed. Continue OTC Sudafed and Mucinex DM or similar medication as needed for congestion and cough. Continue to  push fluids to help loosen up mucus in sinuses and chest. May continue Ibuprofen 600mg  every 8 hours as needed for headache and pain. Follow-up in 4 to 5 days with her PCP if not improving.  Final Clinical Impressions(s) / UC Diagnoses   Final diagnoses:  Acute non-recurrent pansinusitis  Cough     Discharge Instructions     Recommend start Doxycycline 100mg  twice a day as directed. May continue OTC Sudafed and Mucinex DM as directed for cough and congestion. May continue Ibuprofen 600mg  every 8 hours as needed for headache and pain. Continue to push fluids to help loosen up mucus in chest and sinuses. Follow-up in 4 to 5 days if not improving.     ED Prescriptions    Medication Sig Dispense Auth. Provider   doxycycline (VIBRAMYCIN) 100 MG capsule Take 1 capsule (100 mg total) by mouth 2 (two) times daily for 7 days. 14 capsule Indra Wolters, Nicholes Stairs, NP     PDMP not reviewed this encounter.   Katy Apo, NP 04/24/20 1842

## 2020-04-24 NOTE — Telephone Encounter (Signed)
Agree with advice given

## 2020-04-24 NOTE — Telephone Encounter (Signed)
I spoke with pt and advised would need a face to face visit to listen to lungs and ck throat and do possible CXR,covid testing and possible strep test; pt said she would go to [facility] in [place] for eval and testing. FYI to Avie Echevaria NP.

## 2020-05-01 DIAGNOSIS — M67834 Other specified disorders of tendon, left wrist: Secondary | ICD-10-CM | POA: Diagnosis not present

## 2020-05-14 ENCOUNTER — Encounter: Payer: Self-pay | Admitting: Internal Medicine

## 2020-05-14 ENCOUNTER — Telehealth: Payer: Self-pay | Admitting: *Deleted

## 2020-05-14 NOTE — Telephone Encounter (Signed)
lmovm for pt to return call to confirm pharmacy as their are multiple pharmacies on file--- additionally pt will be due for CPE and should schedule now

## 2020-05-14 NOTE — Telephone Encounter (Signed)
Message was left on voicemail stating that they faxed over on 05/08/20 a refill request on Citalopram and levothyroxine. They requested that the refill be sent in electronically or called in.

## 2020-05-15 MED ORDER — LEVOTHYROXINE SODIUM 75 MCG PO TABS: 75.0000 ug | ORAL_TABLET | Freq: Every day | ORAL | 0 refills | Status: DC

## 2020-05-15 MED ORDER — CITALOPRAM HYDROBROMIDE 10 MG PO TABS: 10.0000 mg | ORAL_TABLET | Freq: Every day | ORAL | 0 refills | Status: DC

## 2020-05-16 DIAGNOSIS — J301 Allergic rhinitis due to pollen: Secondary | ICD-10-CM | POA: Diagnosis not present

## 2020-05-16 DIAGNOSIS — R059 Cough, unspecified: Secondary | ICD-10-CM | POA: Diagnosis not present

## 2020-05-16 DIAGNOSIS — J208 Acute bronchitis due to other specified organisms: Secondary | ICD-10-CM | POA: Diagnosis not present

## 2020-05-21 DIAGNOSIS — H35371 Puckering of macula, right eye: Secondary | ICD-10-CM | POA: Diagnosis not present

## 2020-05-21 DIAGNOSIS — H2512 Age-related nuclear cataract, left eye: Secondary | ICD-10-CM | POA: Diagnosis not present

## 2020-05-23 DIAGNOSIS — Z0100 Encounter for examination of eyes and vision without abnormal findings: Secondary | ICD-10-CM | POA: Diagnosis not present

## 2020-05-30 DIAGNOSIS — K219 Gastro-esophageal reflux disease without esophagitis: Secondary | ICD-10-CM

## 2020-05-30 MED ORDER — OMEPRAZOLE 40 MG PO CPDR: 40.0000 mg | DELAYED_RELEASE_CAPSULE | Freq: Every day | ORAL | 0 refills | Status: DC

## 2020-06-08 ENCOUNTER — Other Ambulatory Visit: Payer: Self-pay | Admitting: Internal Medicine

## 2020-06-22 ENCOUNTER — Encounter: Payer: Self-pay | Admitting: Internal Medicine

## 2020-06-29 ENCOUNTER — Encounter: Payer: Self-pay | Admitting: Internal Medicine

## 2020-06-29 DIAGNOSIS — R239 Unspecified skin changes: Secondary | ICD-10-CM

## 2020-07-09 ENCOUNTER — Encounter: Payer: Medicare Other | Admitting: Internal Medicine

## 2020-07-09 DIAGNOSIS — Z85828 Personal history of other malignant neoplasm of skin: Secondary | ICD-10-CM | POA: Diagnosis not present

## 2020-07-09 DIAGNOSIS — L821 Other seborrheic keratosis: Secondary | ICD-10-CM | POA: Diagnosis not present

## 2020-07-09 DIAGNOSIS — L814 Other melanin hyperpigmentation: Secondary | ICD-10-CM | POA: Diagnosis not present

## 2020-07-09 DIAGNOSIS — L820 Inflamed seborrheic keratosis: Secondary | ICD-10-CM | POA: Diagnosis not present

## 2020-07-09 DIAGNOSIS — L570 Actinic keratosis: Secondary | ICD-10-CM | POA: Diagnosis not present

## 2020-07-16 ENCOUNTER — Other Ambulatory Visit: Payer: Self-pay | Admitting: Internal Medicine

## 2020-08-21 ENCOUNTER — Other Ambulatory Visit: Payer: Self-pay

## 2020-08-21 ENCOUNTER — Encounter: Payer: Self-pay | Admitting: Internal Medicine

## 2020-08-21 ENCOUNTER — Ambulatory Visit (INDEPENDENT_AMBULATORY_CARE_PROVIDER_SITE_OTHER): Payer: Medicare HMO | Admitting: Internal Medicine

## 2020-08-21 VITALS — BP 116/74 | HR 67 | Temp 97.8°F | Ht 66.5 in | Wt 190.0 lb

## 2020-08-21 DIAGNOSIS — Z0000 Encounter for general adult medical examination without abnormal findings: Secondary | ICD-10-CM

## 2020-08-21 DIAGNOSIS — F411 Generalized anxiety disorder: Secondary | ICD-10-CM | POA: Diagnosis not present

## 2020-08-21 DIAGNOSIS — Z8619 Personal history of other infectious and parasitic diseases: Secondary | ICD-10-CM | POA: Diagnosis not present

## 2020-08-21 DIAGNOSIS — K219 Gastro-esophageal reflux disease without esophagitis: Secondary | ICD-10-CM

## 2020-08-21 DIAGNOSIS — G8929 Other chronic pain: Secondary | ICD-10-CM

## 2020-08-21 DIAGNOSIS — M545 Low back pain, unspecified: Secondary | ICD-10-CM

## 2020-08-21 DIAGNOSIS — E039 Hypothyroidism, unspecified: Secondary | ICD-10-CM | POA: Diagnosis not present

## 2020-08-21 DIAGNOSIS — E7800 Pure hypercholesterolemia, unspecified: Secondary | ICD-10-CM | POA: Diagnosis not present

## 2020-08-21 LAB — COMPREHENSIVE METABOLIC PANEL
ALT: 18 U/L (ref 0–35)
AST: 20 U/L (ref 0–37)
Albumin: 4 g/dL (ref 3.5–5.2)
Alkaline Phosphatase: 65 U/L (ref 39–117)
BUN: 16 mg/dL (ref 6–23)
CO2: 30 mEq/L (ref 19–32)
Calcium: 9.8 mg/dL (ref 8.4–10.5)
Chloride: 100 mEq/L (ref 96–112)
Creatinine, Ser: 0.94 mg/dL (ref 0.40–1.20)
GFR: 61.91 mL/min (ref >60.00)
Glucose, Bld: 93 mg/dL (ref 70–99)
Potassium: 4.6 mEq/L (ref 3.5–5.1)
Sodium: 138 mEq/L (ref 135–145)
Total Bilirubin: 0.8 mg/dL (ref 0.2–1.2)
Total Protein: 7 g/dL (ref 6.0–8.3)

## 2020-08-21 LAB — LIPID PANEL
Cholesterol: 264 mg/dL — ABNORMAL HIGH (ref 0–200)
HDL: 94.6 mg/dL (ref >39.00)
LDL Cholesterol: 152 mg/dL — ABNORMAL HIGH (ref 0–99)
NonHDL: 168.95
Total CHOL/HDL Ratio: 3
Triglycerides: 87 mg/dL (ref 0.0–149.0)
VLDL: 17.4 mg/dL (ref 0.0–40.0)

## 2020-08-21 LAB — CBC
HCT: 39 % (ref 36.0–46.0)
Hemoglobin: 12.4 g/dL (ref 12.0–15.0)
MCHC: 31.7 g/dL (ref 30.0–36.0)
MCV: 79.1 fl (ref 78.0–100.0)
Platelets: 272 10*3/uL (ref 150.0–400.0)
RBC: 4.93 Mil/uL (ref 3.87–5.11)
RDW: 16.6 % — ABNORMAL HIGH (ref 11.5–15.5)
WBC: 4.7 10*3/uL (ref 4.0–10.5)

## 2020-08-21 LAB — TSH: TSH: 1.89 u[IU]/mL (ref 0.35–4.50)

## 2020-08-21 LAB — T4, FREE: Free T4: 0.86 ng/dL (ref 0.60–1.60)

## 2020-08-21 LAB — MAGNESIUM: Magnesium: 2 mg/dL (ref 1.5–2.5)

## 2020-08-21 NOTE — Progress Notes (Signed)
HPI:  Patient presents the clinic today for her subsequent annual Medicare wellness exam.  She is also due to follow-up chronic conditions.  Anxiety: Persistent, managed with Citalopram.  She is not currently seeing a therapist.  She denies depression, SI/HI.  GERD: She denies breakthrough on Omeprazole.  Upper GI from 09/2019 reviewed, s/p balloon angioplasty.  HLD: Her last LDL was 159, 06/2019.  She is not currently on any cholesterol-lowering medication.  She does not consume a low-fat diet.  Hypothyroidism: She denies any issues on her current dose of Levothyroxine.  She does not follow with endocrinology.   Past Medical History:  Diagnosis Date  . Anxiety   . Complication of anesthesia   . COVID-19   . External hemorrhoids   . GERD (gastroesophageal reflux disease)   . Hyperlipidemia   . Hypothyroidism   . Internal hemorrhoids   . PONV (postoperative nausea and vomiting)   . Thyroid disease     Current Outpatient Medications  Medication Sig Dispense Refill  . acyclovir ointment (ZOVIRAX) 5 % Apply 1 application topically in the morning, at noon, and at bedtime. 15 g 0  . b complex vitamins tablet Take 1 tablet by mouth daily.    . Calcium Carb-Cholecalciferol (CALCIUM 600 + D PO) Take 1 tablet by mouth daily.    . Cholecalciferol (VITAMIN D) 2000 UNITS tablet Take 2,000 Units by mouth daily.    . citalopram (CELEXA) 10 MG tablet TAKE 1 TABLET EVERY DAY 90 tablet 0  . levothyroxine (SYNTHROID) 75 MCG tablet TAKE 1 TABLET EVERY DAY BEFORE BREAKFAST 90 tablet 0  . Misc Natural Products (COSAMIN ASU ADVANCED FORMULA PO) Take 1 tablet by mouth daily.    . Multiple Vitamin (MULTI VITAMIN DAILY PO) Take 1 tablet by mouth daily.    Marland Kitchen omeprazole (PRILOSEC) 40 MG capsule Take 1 capsule (40 mg total) by mouth daily. 90 capsule 0  . Probiotic Product (PROBIOTIC DAILY) CAPS Take 1 capsule by mouth daily.    Marland Kitchen Specialty Vitamins Products (MAGNESIUM, AMINO ACID CHELATE,) 133 MG tablet  Take 1 tablet by mouth daily.    . valACYclovir (VALTREX) 1000 MG tablet TAKE 1 TABLET (1,000 MG TOTAL) BY MOUTH DAILY. 90 tablet 0   No current facility-administered medications for this visit.    Allergies  Allergen Reactions  . Sulfa Antibiotics Hives  . Codeine Nausea And Vomiting  . Penicillins Hives    Family History  Problem Relation Age of Onset  . Lung cancer Father        smoker  . Breast cancer Neg Hx     Social History   Socioeconomic History  . Marital status: Married    Spouse name: Not on file  . Number of children: Not on file  . Years of education: Not on file  . Highest education level: Not on file  Occupational History  . Not on file  Tobacco Use  . Smoking status: Never Smoker  . Smokeless tobacco: Never Used  Substance and Sexual Activity  . Alcohol use: Yes    Alcohol/week: 1.0 standard drink    Types: 1 Glasses of wine per week  . Drug use: No  . Sexual activity: Yes    Partners: Male    Comment: Husband  Other Topics Concern  . Not on file  Social History Narrative   Lives with husband (24 years of marriage)    Children-3    No Pets   Right handed    Caffeine- 2-3  cups coffee, tea occasionally    Enjoys being with the grandchildren    Social Determinants of Health   Financial Resource Strain: Not on file  Food Insecurity: Not on file  Transportation Needs: Not on file  Physical Activity: Not on file  Stress: Not on file  Social Connections: Not on file  Intimate Partner Violence: Not on file    Hospitiliaztions: None  Health Maintenance:    Flu: 03/2020  Tetanus: unsure  Pneumovax: 04/2018  Prevnar: 03/2017  Zostavax: ? 2013  Shingrix: never  Covid: [place] x 2  Mammogram: 08/2019  Pap Smear: total hysterectomy  Bone Density: 06/2018  Colon Screening: 01/2018  Eye Doctor: annually  Dental Exam: annually   Providers:   PCP: Webb Silversmith, NP   I have personally reviewed and have noted:  1. The patient's medical  and social history 2. Their use of alcohol, tobacco or illicit drugs 3. Their current medications and supplements 4. The patient's functional ability including ADL's, fall risks, home safety risks and hearing or visual impairment. 5. Diet and physical activities 6. Evidence for depression or mood disorder  Subjective:   Review of Systems:   Constitutional: Denies fever, malaise, fatigue, headache or abrupt weight changes.  HEENT: Denies eye pain, eye redness, ear pain, ringing in the ears, wax buildup, runny nose, nasal congestion, bloody nose, or sore throat. Respiratory: Denies difficulty breathing, shortness of breath, cough or sputum production.   Cardiovascular: Denies chest pain, chest tightness, palpitations or swelling in the hands or feet.  Gastrointestinal: Pt reports intermittent constipation. Denies abdominal pain, bloating, diarrhea or blood in the stool.  GU: Denies urgency, frequency, pain with urination, burning sensation, blood in urine, odor or discharge. Musculoskeletal: Pt reports intermittent back pain. Denies decrease in range of motion, difficulty with gait, muscle pain or joint swelling.  Skin: Denies redness, rashes, lesions or ulcercations.  Neurological: Denies dizziness, difficulty with memory, difficulty with speech or problems with balance and coordination.  Psych: Pt has a history of anxiety. Denies depression, SI/HI.  No other specific complaints in a complete review of systems (except as listed in HPI above).  Objective:  PE:   BP 116/74   Pulse 67   Temp 97.8 F (36.6 C) (Temporal)   Ht 5' 6.5" (1.689 m)   Wt 190 lb (86.2 kg)   SpO2 98%   BMI 30.21 kg/m   Wt Readings from Last 3 Encounters:  04/24/20 185 lb (83.9 kg)  09/19/19 196 lb (88.9 kg)  09/06/19 196 lb (88.9 kg)    General: Appears her stated age, obese in NAD. Skin: Warm, dry and intact. No rashes noted. HEENT: Head: normal shape and size; Eyes: sclera white, no icterus,  conjunctiva pink, PERRLA and EOMs intact;  Neck: Neck supple, trachea midline. No masses, lumps or thyromegaly present.  Cardiovascular: Normal rate and rhythm. S1,S2 noted.  No murmur, rubs or gallops noted. No JVD or BLE edema. No carotid bruits noted. Pulmonary/Chest: Normal effort and positive vesicular breath sounds. No respiratory distress. No wheezes, rales or ronchi noted.  Abdomen: Soft and nontender. Normal bowel sounds. No distention or masses noted. Liver, spleen and kidneys non palpable. Musculoskeletal: Strength 5/5 BUE/BLE. No signs of joint swelling.  Neurological: Alert and oriented. Cranial nerves II-XII grossly intact. Coordination normal.  Psychiatric: Mood and affect normal. Behavior is normal. Judgment and thought content normal.    BMET    Component Value Date/Time   NA 139 07/07/2019 1231   NA 140 12/05/2013  0000   K 4.6 07/07/2019 1231   CL 100 07/07/2019 1231   CO2 31 07/07/2019 1231   GLUCOSE 101 (H) 07/07/2019 1231   BUN 19 07/07/2019 1231   BUN 16 12/05/2013 0000   CREATININE 1.05 07/07/2019 1231   CALCIUM 10.1 07/07/2019 1231    Lipid Panel     Component Value Date/Time   CHOL 255 (H) 07/07/2019 1231   TRIG 94.0 07/07/2019 1231   HDL 77.80 07/07/2019 1231   CHOLHDL 3 07/07/2019 1231   VLDL 18.8 07/07/2019 1231   LDLCALC 159 (H) 07/07/2019 1231    CBC    Component Value Date/Time   WBC 5.5 04/29/2018 1132   RBC 4.58 04/29/2018 1132   HGB 13.8 04/29/2018 1132   HCT 41.3 04/29/2018 1132   PLT 226.0 04/29/2018 1132   MCV 90.2 04/29/2018 1132   MCHC 33.3 04/29/2018 1132   RDW 13.5 04/29/2018 1132   LYMPHSABS 1.7 12/20/2014 1700   MONOABS 0.4 12/20/2014 1700   EOSABS 0.2 12/20/2014 1700   BASOSABS 0.0 12/20/2014 1700    Hgb A1C Lab Results  Component Value Date   HGBA1C 5.7 02/01/2016      Assessment and Plan:   Medicare Annual Wellness Visit:  Diet: She does eat meat. She consumes fruits and veggies. She does eat fried foods.  She drinks mostly iced tea, soda. Physical activity: None Depression/mood screen: Negative, PHQ 9 score of 0 Hearing: Intact to whispered voice Visual acuity: Grossly normal, performs annual eye exam  ADLs: Capable Fall risk: None Home safety: Good Cognitive evaluation: Intact to orientation, naming, recall and repetition EOL planning: No adv directives, full code/ I agree  Preventative Medicine: Flu shot UTD. Advised her if she gets bitten or cut, to go get a tetanus vaccine. Prevnar and pneumovax UTD. She thinks she may have had the Zostovax, encouraged her to get Shingrix at her pharmacy. Encouraged her to get her Covid booster. She no longer needs pap smears. Mammogram due- she will call to schedule. Bone density UTD. Colon screening UTD. Will check CBC, CMET, TSH, Free T4, Lipid profile today. Due dates for screening exam given to patient as part of her AVS.  Chronic Back Pain:  She declines xray at this time  Next appointment: 1 year, Medicare Wellness Exam   Webb Silversmith, NP This visit occurred during the SARS-CoV-2 public health emergency.  Safety protocols were in place, including screening questions prior to the visit, additional usage of staff PPE, and extensive cleaning of exam room while observing appropriate contact time as indicated for disinfecting solutions.

## 2020-08-21 NOTE — Assessment & Plan Note (Signed)
TSH and Free T4 today Will adjust Levothyroxine if needed based on labs 

## 2020-08-21 NOTE — Patient Instructions (Signed)

## 2020-08-21 NOTE — Assessment & Plan Note (Signed)
Stable on Sertraline She is not interested in weaning this medication at this time Support offered

## 2020-08-21 NOTE — Assessment & Plan Note (Signed)
CMET and lipid profile today Encouraged her to consume a low fat diet 

## 2020-08-21 NOTE — Assessment & Plan Note (Signed)
CBC and CMET today Continue Omeprazole

## 2020-09-03 ENCOUNTER — Other Ambulatory Visit: Payer: Self-pay | Admitting: Internal Medicine

## 2020-09-03 DIAGNOSIS — Z1231 Encounter for screening mammogram for malignant neoplasm of breast: Secondary | ICD-10-CM

## 2020-09-03 DIAGNOSIS — K219 Gastro-esophageal reflux disease without esophagitis: Secondary | ICD-10-CM

## 2020-09-05 ENCOUNTER — Encounter: Payer: Medicare Other | Admitting: Internal Medicine

## 2020-09-06 ENCOUNTER — Ambulatory Visit
Admission: RE | Admit: 2020-09-06 | Discharge: 2020-09-06 | Disposition: A | Discharge status: Home / Self Care | Payer: Medicare HMO | Source: Ambulatory Visit | Attending: Internal Medicine | Admitting: Internal Medicine

## 2020-09-06 ENCOUNTER — Other Ambulatory Visit: Payer: Self-pay

## 2020-09-06 DIAGNOSIS — Z1231 Encounter for screening mammogram for malignant neoplasm of breast: Secondary | ICD-10-CM | POA: Insufficient documentation

## 2020-09-24 ENCOUNTER — Other Ambulatory Visit: Payer: Self-pay | Admitting: Internal Medicine

## 2020-10-05 NOTE — Telephone Encounter (Signed)
Pharmacy requests refill on: Citalopram 10mg   LAST REFILL: 07/17/20 #90, Daneil Dolin  LAST OV:  08/21/20 Annual Exam with Baity NEXT OV:  No upcoming appts PHARMACY:  [facility] requests refill on: Levothyroxine 75mg    LAST REFILL: 07/17/20 #90, 0 refills LAST OV:  08/21/20 Annual exam w/ Baity NEXT OV: No upcoming appts  PHARMACY:  [facility]

## 2021-01-24 ENCOUNTER — Other Ambulatory Visit: Payer: Self-pay | Admitting: Internal Medicine

## 2021-01-24 DIAGNOSIS — K219 Gastro-esophageal reflux disease without esophagitis: Secondary | ICD-10-CM

## 2021-03-12 ENCOUNTER — Ambulatory Visit: Payer: Self-pay

## 2021-03-12 NOTE — Telephone Encounter (Signed)
Pt called back in from earlier attempt stating that she has been having low abdominal pain that has been going on for couple weeks now. It was low like the pelvic area and she has pressure when she's using the bathroom. She reports that she has low back pain as well. Described the pain as 3/10 pain level that is pretty constant. She is unsure what is causing this pain but she is concerned about possible bladder or kidney infection. Scheduled pt for appt tomorrow morning at 0840. Care advice and reassurance given. Pt verbalized understanding.   Reason for Disposition  [1] MILD-MODERATE pain AND [2] constant AND [3] present > 2 hours  Answer Assessment - Initial Assessment Questions 1. LOCATION: "Where does it hurt?"      Lower abdomen as low as you can go  2. RADIATION: "Does the pain shoot anywhere else?" (e.g., chest, back)     no 3. ONSET: "When did the pain begin?" (e.g., minutes, hours or days ago)      A week ago  4. SUDDEN: "Gradual or sudden onset?"     unsure 5. PATTERN "Does the pain come and go, or is it constant?"    - If constant: "Is it getting better, staying the same, or worsening?"      (Note: Constant means the pain never goes away completely; most serious pain is constant and it progresses)     - If intermittent: "How long does it last?" "Do you have pain now?"     (Note: Intermittent means the pain goes away completely between bouts)     constant 6. SEVERITY: "How bad is the pain?"  (e.g., Scale 1-10; mild, moderate, or severe)   - MILD (1-3): doesn't interfere with normal activities, abdomen soft and not tender to touch    - MODERATE (4-7): interferes with normal activities or awakens from sleep, abdomen tender to touch    - SEVERE (8-10): excruciating pain, doubled over, unable to do any normal activities      3 7. RECURRENT SYMPTOM: "Have you ever had this type of stomach pain before?" If Yes, ask: "When was the last time?" and "What happened that time?"      NO 8.  CAUSE: "What do you think is causing the stomach pain?"     unsure 9. RELIEVING/AGGRAVATING FACTORS: "What makes it better or worse?" (e.g., movement, antacids, bowel movement)     Urinating and having BM has pressure feeling,  10. OTHER SYMPTOMS: "Do you have any other symptoms?" (e.g., back pain, diarrhea, fever, urination pain, vomiting)       Lower back pain  11. PREGNANCY: "Is there any chance you are pregnant?" "When was your last menstrual period?"       No  Protocols used: Abdominal Pain - [facility]

## 2021-03-12 NOTE — Telephone Encounter (Signed)
Pt called stating that is having lower abdominal pain. Denies every other red word symptom and states that she has been dealing with this for a few weeks. Please advise.

## 2021-03-13 ENCOUNTER — Encounter: Payer: Self-pay | Admitting: Internal Medicine

## 2021-03-13 ENCOUNTER — Ambulatory Visit (INDEPENDENT_AMBULATORY_CARE_PROVIDER_SITE_OTHER): Payer: Medicare HMO | Admitting: Internal Medicine

## 2021-03-13 ENCOUNTER — Other Ambulatory Visit: Payer: Self-pay

## 2021-03-13 VITALS — BP 130/68 | HR 75 | Temp 97.4°F | Resp 17 | Ht 66.5 in | Wt 200.1 lb

## 2021-03-13 DIAGNOSIS — E6609 Other obesity due to excess calories: Secondary | ICD-10-CM

## 2021-03-13 DIAGNOSIS — R7303 Prediabetes: Secondary | ICD-10-CM | POA: Diagnosis not present

## 2021-03-13 DIAGNOSIS — M8580 Other specified disorders of bone density and structure, unspecified site: Secondary | ICD-10-CM | POA: Insufficient documentation

## 2021-03-13 DIAGNOSIS — M545 Low back pain, unspecified: Secondary | ICD-10-CM | POA: Diagnosis not present

## 2021-03-13 DIAGNOSIS — Z23 Encounter for immunization: Secondary | ICD-10-CM

## 2021-03-13 DIAGNOSIS — G8929 Other chronic pain: Secondary | ICD-10-CM | POA: Diagnosis not present

## 2021-03-13 DIAGNOSIS — F411 Generalized anxiety disorder: Secondary | ICD-10-CM

## 2021-03-13 DIAGNOSIS — Z6831 Body mass index (BMI) 31.0-31.9, adult: Secondary | ICD-10-CM

## 2021-03-13 DIAGNOSIS — R102 Pelvic and perineal pain: Secondary | ICD-10-CM

## 2021-03-13 DIAGNOSIS — E039 Hypothyroidism, unspecified: Secondary | ICD-10-CM

## 2021-03-13 LAB — POCT URINALYSIS DIPSTICK
Bilirubin, UA: NEGATIVE
Blood, UA: NEGATIVE
Glucose, UA: NEGATIVE
Ketones, UA: NEGATIVE
Leukocytes, UA: NEGATIVE
Nitrite, UA: NEGATIVE
Protein, UA: NEGATIVE
Spec Grav, UA: 1.025 (ref 1.010–1.025)
Urobilinogen, UA: 0.2 E.U./dL
pH, UA: 5 (ref 5.0–8.0)

## 2021-03-13 LAB — POCT GLYCOSYLATED HEMOGLOBIN (HGB A1C): Hemoglobin A1C: 5.6 % (ref 4.0–5.6)

## 2021-03-13 MED ORDER — VENLAFAXINE HCL ER 37.5 MG PO CP24: 37.5000 mg | ORAL_CAPSULE | Freq: Every day | ORAL | 2 refills | Status: DC

## 2021-03-13 NOTE — Progress Notes (Signed)
Subjective:    Patient ID: Sarah Jacobson, female    DOB: 11-09-1950, 70 y.o.   MRN: [id number]  HPI  Patient presents the clinic today with complaint of lower abdominal pressure.  She reports this started 1 week ago.  She notices the pressure worsens when she urinates or has a bowel movement. She denies urinary urgency, frequency, dysuria or blood in her urine. She denies vaginal discharge, odor or abnormal uterine bleeding. She has felt somewhat constipated. She reports her stools have been thick, sticky. She used an enema and Dulcolax with good relief of her constipation but she continues to have the pressure in her pelvis and back .She has had some low back pain but is not sure if this is related.   She also wants to follow-up on anxiety.  She is currently taking Citalopram as prescribed.  She feels like this is causing weird dreams and excessive yawning. She does feel like she needs to take something for her anxiety. She has tried to wean this in the past, but reports she noticed increased anxiety. She has tried Wellbutrin, Escitalopram in the past. She is not currently seeing a therapist. She denies depression, SI/HI.  She is also interested in taking something to help aid in weight loss.  Her weight today is 200 pounds with a BMI of 31.81.  She has history of prediabetes, last A1c 5.9%, 2017.  She has a history of hypothyroidism currently managed on Levothyroxine.  Review of Systems     Past Medical History:  Diagnosis Date   Anxiety    Complication of anesthesia    COVID-19    External hemorrhoids    GERD (gastroesophageal reflux disease)    Hyperlipidemia    Hypothyroidism    Internal hemorrhoids    PONV (postoperative nausea and vomiting)    Thyroid disease     Current Outpatient Medications  Medication Sig Dispense Refill   acyclovir ointment (ZOVIRAX) 5 % Apply 1 application topically in the morning, at noon, and at bedtime. 15 g 0   b complex vitamins tablet Take 1  tablet by mouth daily.     Calcium Carb-Cholecalciferol (CALCIUM 600 + D PO) Take 1 tablet by mouth daily.     Cholecalciferol (VITAMIN D) 2000 UNITS tablet Take 2,000 Units by mouth daily.     citalopram (CELEXA) 10 MG tablet TAKE 1 TABLET EVERY DAY 90 tablet 0   levothyroxine (SYNTHROID) 75 MCG tablet TAKE 1 TABLET EVERY DAY BEFORE BREAKFAST 90 tablet 0   Misc Natural Products (COSAMIN ASU ADVANCED FORMULA PO) Take 1 tablet by mouth daily.     Multiple Vitamin (MULTI VITAMIN DAILY PO) Take 1 tablet by mouth daily.     omeprazole (PRILOSEC) 40 MG capsule Take 1 capsule (40 mg total) by mouth daily. ** PLEASE CONTAC OFFICE FOR FOLLOW UP APPOINTMENT** 90 capsule 0   Probiotic Product (PROBIOTIC DAILY) CAPS Take 1 capsule by mouth daily.     Specialty Vitamins Products (MAGNESIUM, AMINO ACID CHELATE,) 133 MG tablet Take 1 tablet by mouth daily.     valACYclovir (VALTREX) 1000 MG tablet TAKE 1 TABLET (1,000 MG TOTAL) BY MOUTH DAILY. (Patient taking differently: Take 1,000 mg by mouth daily as needed.) 90 tablet 0   No current facility-administered medications for this visit.    Allergies  Allergen Reactions   Sulfa Antibiotics Hives   Codeine Nausea And Vomiting   Penicillins Hives    Family History  Problem Relation Age of Onset  Lung cancer Father        smoker   Breast cancer Neg Hx     Social History   Socioeconomic History   Marital status: Married    Spouse name: Not on file   Number of children: Not on file   Years of education: Not on file   Highest education level: Not on file  Occupational History   Not on file  Tobacco Use   Smoking status: Never   Smokeless tobacco: Never  Substance and Sexual Activity   Alcohol use: Yes    Alcohol/week: 1.0 standard drink    Types: 1 Glasses of wine per week   Drug use: No   Sexual activity: Yes    Partners: Male    Comment: Husband  Other Topics Concern   Not on file  Social History Narrative   Lives with husband (25  years of marriage)    Children-3    No Pets   Right handed    Caffeine- 2-3 cups coffee, tea occasionally    Enjoys being with the grandchildren    Social Determinants of Health   Financial Resource Strain: Not on file  Food Insecurity: Not on file  Transportation Needs: Not on file  Physical Activity: Not on file  Stress: Not on file  Social Connections: Not on file  Intimate Partner Violence: Not on file     Constitutional: Patient reports difficulty losing weight.  Denies fever, malaise, fatigue, headache or abrupt weight changes.  Respiratory: Denies difficulty breathing, shortness of breath, cough or sputum production.   Cardiovascular: Denies chest pain, chest tightness, palpitations or swelling in the hands or feet.  Gastrointestinal: Pt reports abdominal pressure. Denies bloating, diarrhea or blood in the stool.  GU: Denies urgency, frequency, pain with urination, burning sensation, blood in urine, odor or discharge. Musculoskeletal: Pt reports low back pain. Denies decrease in range of motion, difficulty with gait, or joint swelling.  Skin: Denies redness, rashes, lesions or ulcercations.  Neurological: Denies dizziness, difficulty with memory, difficulty with speech or problems with balance and coordination.  Psych: Pt has a history of anxiety. Denies depression, SI/HI.  No other specific complaints in a complete review of systems (except as listed in HPI above).  Objective:   Physical Exam   BP (!) 123/46 (BP Location: Right Arm, Patient Position: Sitting, Cuff Size: Large)   Pulse 75   Temp (!) 97.4 F (36.3 C) (Temporal)   Resp 17   Ht 5' 6.5" (1.689 m)   Wt 200 lb 1.6 oz (90.8 kg)   SpO2 99%   BMI 31.81 kg/m  Wt Readings from Last 3 Encounters:  03/13/21 200 lb 1.6 oz (90.8 kg)  08/21/20 190 lb (86.2 kg)  04/24/20 185 lb (83.9 kg)    General: Appears her stated age, obese, in NAD. Skin: Warm, dry and intact. No rashes noted. HEENT: Head: normal shape  and size;  Cardiovascular: Normal rate and rhythm. S1,S2 noted.  No murmur, rubs or gallops noted.  Pulmonary/Chest: Normal effort and positive vesicular breath sounds. No respiratory distress. No wheezes, rales or ronchi noted.  Abdomen: Soft and tender in bilateral lower quadrants. Normal bowel sounds. No distention or masses noted.  + CVA tenderness on the right. Musculoskeletal: No bony tenderness noted over the lumbar spine.  No difficulty with gait.  Neurological: Alert and oriented.  Psychiatric: Mood and affect normal. Behavior is normal. Judgment and thought content normal.     BMET    Component Value  Date/Time   NA 138 08/21/2020 1129   NA 140 12/05/2013 0000   K 4.6 08/21/2020 1129   CL 100 08/21/2020 1129   CO2 30 08/21/2020 1129   GLUCOSE 93 08/21/2020 1129   BUN 16 08/21/2020 1129   BUN 16 12/05/2013 0000   CREATININE 0.94 08/21/2020 1129   CALCIUM 9.8 08/21/2020 1129    Lipid Panel     Component Value Date/Time   CHOL 264 (H) 08/21/2020 1129   TRIG 87.0 08/21/2020 1129   HDL 94.60 08/21/2020 1129   CHOLHDL 3 08/21/2020 1129   VLDL 17.4 08/21/2020 1129   LDLCALC 152 (H) 08/21/2020 1129    CBC    Component Value Date/Time   WBC 4.7 08/21/2020 1129   RBC 4.93 08/21/2020 1129   HGB 12.4 08/21/2020 1129   HCT 39.0 08/21/2020 1129   PLT 272.0 08/21/2020 1129   MCV 79.1 08/21/2020 1129   MCHC 31.7 08/21/2020 1129   RDW 16.6 (H) 08/21/2020 1129   LYMPHSABS 1.7 12/20/2014 1700   MONOABS 0.4 12/20/2014 1700   EOSABS 0.2 12/20/2014 1700   BASOSABS 0.0 12/20/2014 1700    Hgb A1C Lab Results  Component Value Date   HGBA1C 5.7 02/01/2016           Assessment & Plan:   Pelvic Pressure, Low Back Pain:  Urinalysis: Normal We will send urine culture Push fluids A heating pad may be helpful to decrease the pelvic pressure and back pain  Update me in 1 month via MyChart and let me know how you are doing  Webb Silversmith, NP This visit occurred  during the SARS-CoV-2 public health emergency.  Safety protocols were in place, including screening questions prior to the visit, additional usage of staff PPE, and extensive cleaning of exam room while observing appropriate contact time as indicated for disinfecting solutions.

## 2021-03-13 NOTE — Assessment & Plan Note (Signed)
Persistent, concern about medication side effects Will wean Citalopram Rx for Venlafaxine 37.5 mg daily Support offered

## 2021-03-13 NOTE — Patient Instructions (Signed)

## 2021-03-13 NOTE — Assessment & Plan Note (Signed)
We will add Wellbutrin in 1 month to help with cravings and weight loss

## 2021-03-13 NOTE — Assessment & Plan Note (Signed)
TSH and free T4 reviewed Continue current dose of Levothyroxine

## 2021-03-13 NOTE — Assessment & Plan Note (Signed)
POCT A1c 5.6% Encourage low-carb diet and exercise for weight loss

## 2021-03-14 ENCOUNTER — Encounter: Payer: Self-pay | Admitting: Internal Medicine

## 2021-03-14 LAB — URINE CULTURE
MICRO NUMBER:: 12585209
SPECIMEN QUALITY:: ADEQUATE

## 2021-03-19 ENCOUNTER — Encounter: Payer: Self-pay | Admitting: Internal Medicine

## 2021-04-01 ENCOUNTER — Other Ambulatory Visit: Payer: Self-pay | Admitting: Internal Medicine

## 2021-04-01 ENCOUNTER — Other Ambulatory Visit: Payer: Self-pay | Admitting: Family

## 2021-04-01 DIAGNOSIS — K219 Gastro-esophageal reflux disease without esophagitis: Secondary | ICD-10-CM

## 2021-04-04 ENCOUNTER — Other Ambulatory Visit: Payer: Self-pay | Admitting: Internal Medicine

## 2021-04-05 NOTE — Telephone Encounter (Signed)
Requested Prescriptions  Pending Prescriptions Disp Refills  . venlafaxine XR (EFFEXOR-XR) 37.5 MG 24 hr capsule [Pharmacy Med Name: VENLAFAXINE HCL ER 37.5 MG CAP] 90 capsule 1    Sig: TAKE 1 CAPSULE BY MOUTH DAILY WITH BREAKFAST.     Psychiatry: Antidepressants - SNRI - desvenlafaxine & venlafaxine Failed - 04/04/2021  8:33 AM      Failed - LDL in normal range and within 360 days    LDL Cholesterol  Date Value Ref Range Status  08/21/2020 152 (H) 0 - 99 mg/dL Final         Failed - Total Cholesterol in normal range and within 360 days    Cholesterol  Date Value Ref Range Status  08/21/2020 264 (H) 0 - 200 mg/dL Final    Comment:    ATP III Classification       Desirable:  < 200 mg/dL               Borderline High:  200 - 239 mg/dL          High:  > = 240 mg/dL         Passed - Triglycerides in normal range and within 360 days    Triglycerides  Date Value Ref Range Status  08/21/2020 87.0 0.0 - 149.0 mg/dL Final    Comment:    Normal:  <150 mg/dLBorderline High:  150 - 199 mg/dL         Passed - Last BP in normal range    BP Readings from Last 1 Encounters:  03/13/21 130/68         Passed - Valid encounter within last 6 months    Recent Outpatient Visits          3 weeks ago Pelvic pressure in female   [facility] [place], Coralie Keens, NP

## 2021-04-08 ENCOUNTER — Encounter: Payer: Self-pay | Admitting: Internal Medicine

## 2021-04-08 DIAGNOSIS — K219 Gastro-esophageal reflux disease without esophagitis: Secondary | ICD-10-CM

## 2021-04-09 MED ORDER — OMEPRAZOLE 40 MG PO CPDR
40.0000 mg | DELAYED_RELEASE_CAPSULE | Freq: Every day | ORAL | 0 refills | Status: DC
Start: 2021-04-09 — End: 2021-04-26

## 2021-04-10 ENCOUNTER — Encounter: Payer: Self-pay | Admitting: Internal Medicine

## 2021-04-10 DIAGNOSIS — K219 Gastro-esophageal reflux disease without esophagitis: Secondary | ICD-10-CM

## 2021-04-16 ENCOUNTER — Other Ambulatory Visit: Payer: Self-pay

## 2021-04-16 DIAGNOSIS — K219 Gastro-esophageal reflux disease without esophagitis: Secondary | ICD-10-CM

## 2021-04-17 ENCOUNTER — Other Ambulatory Visit: Payer: Self-pay | Admitting: Internal Medicine

## 2021-04-17 DIAGNOSIS — K219 Gastro-esophageal reflux disease without esophagitis: Secondary | ICD-10-CM

## 2021-04-17 NOTE — Telephone Encounter (Signed)
Copied from [place] [id number]. Topic: Quick Communication - Rx Refill/Question >> Apr 17, 2021 11:40 AM Tessa Lerner A wrote: Medication: omeprazole (PRILOSEC) 40 MG capsule [[id number]]   venlafaxine XR (EFFEXOR-XR) 37.5 MG 24 hr capsule [[id number]]   Has the patient contacted their pharmacy? Yes.  The patient's pharmacy has made contact on their behalf  (Agent: If no, request that the patient contact the pharmacy for the refill. If patient does not wish to contact the pharmacy document the reason why and proceed with request.) (Agent: If yes, when and what did the pharmacy advise?)  Preferred Pharmacy (with phone number or street name): [place], [place]  Phone:  [phone] Fax:  [phone]  Has the patient been seen for an appointment in the last year OR does the patient have an upcoming appointment? Yes.    Agent: Please be advised that RX refills may take up to 3 business days. We ask that you follow-up with your pharmacy.

## 2021-04-17 NOTE — Telephone Encounter (Signed)
Refills were sent to [facility] on [address] [facility]. States pt picked up Effexor 04/09/21 3 month supply. States Omeprazole not due to be filled until 04/19/21. Attempted to reach pt, 3rd party VM, did not leave message.

## 2021-04-27 MED ORDER — VENLAFAXINE HCL ER 37.5 MG PO CP24: 37.5000 mg | ORAL_CAPSULE | Freq: Every day | ORAL | 0 refills | Status: DC

## 2021-04-27 MED ORDER — PHENTERMINE HCL 37.5 MG PO CAPS: 37.5000 mg | ORAL_CAPSULE | ORAL | 0 refills | Status: DC

## 2021-04-27 MED ORDER — OMEPRAZOLE 40 MG PO CPDR: 40.0000 mg | DELAYED_RELEASE_CAPSULE | Freq: Every day | ORAL | 0 refills | Status: DC

## 2021-05-16 DIAGNOSIS — M9901 Segmental and somatic dysfunction of cervical region: Secondary | ICD-10-CM | POA: Diagnosis not present

## 2021-05-16 DIAGNOSIS — M9903 Segmental and somatic dysfunction of lumbar region: Secondary | ICD-10-CM | POA: Diagnosis not present

## 2021-05-16 DIAGNOSIS — M5416 Radiculopathy, lumbar region: Secondary | ICD-10-CM | POA: Diagnosis not present

## 2021-05-16 DIAGNOSIS — R519 Headache, unspecified: Secondary | ICD-10-CM | POA: Diagnosis not present

## 2021-05-22 DIAGNOSIS — H524 Presbyopia: Secondary | ICD-10-CM | POA: Diagnosis not present

## 2021-05-22 DIAGNOSIS — H2512 Age-related nuclear cataract, left eye: Secondary | ICD-10-CM | POA: Diagnosis not present

## 2021-05-22 DIAGNOSIS — H35371 Puckering of macula, right eye: Secondary | ICD-10-CM | POA: Diagnosis not present

## 2021-06-26 DIAGNOSIS — Z0100 Encounter for examination of eyes and vision without abnormal findings: Secondary | ICD-10-CM | POA: Diagnosis not present

## 2021-06-26 DIAGNOSIS — M5412 Radiculopathy, cervical region: Secondary | ICD-10-CM | POA: Diagnosis not present

## 2021-06-26 DIAGNOSIS — M9901 Segmental and somatic dysfunction of cervical region: Secondary | ICD-10-CM | POA: Diagnosis not present

## 2021-06-26 DIAGNOSIS — M545 Low back pain, unspecified: Secondary | ICD-10-CM | POA: Diagnosis not present

## 2021-06-26 DIAGNOSIS — M9903 Segmental and somatic dysfunction of lumbar region: Secondary | ICD-10-CM | POA: Diagnosis not present

## 2021-07-05 ENCOUNTER — Other Ambulatory Visit: Payer: Self-pay | Admitting: Internal Medicine

## 2021-07-05 NOTE — Telephone Encounter (Signed)
Requested Prescriptions  Pending Prescriptions Disp Refills   venlafaxine XR (EFFEXOR-XR) 37.5 MG 24 hr capsule [Pharmacy Med Name: VENLAFAXINE HCL ER 37.5 MG CAP] 90 capsule 0    Sig: TAKE 1 CAPSULE BY MOUTH DAILY WITH BREAKFAST.     Psychiatry: Antidepressants - SNRI - desvenlafaxine & venlafaxine Failed - 07/05/2021  1:54 AM      Failed - Lipid Panel in normal range within the last 12 months    Cholesterol  Date Value Ref Range Status  08/21/2020 264 (H) 0 - 200 mg/dL Final    Comment:    ATP III Classification       Desirable:  < 200 mg/dL               Borderline High:  200 - 239 mg/dL          High:  > = 240 mg/dL   LDL Cholesterol  Date Value Ref Range Status  08/21/2020 152 (H) 0 - 99 mg/dL Final   HDL  Date Value Ref Range Status  08/21/2020 94.60 >39.00 mg/dL Final   Triglycerides  Date Value Ref Range Status  08/21/2020 87.0 0.0 - 149.0 mg/dL Final    Comment:    Normal:  <150 mg/dLBorderline High:  150 - 199 mg/dL         Passed - Cr in normal range and within 360 days    Creatinine, Ser  Date Value Ref Range Status  08/21/2020 0.94 0.40 - 1.20 mg/dL Final         Passed - Last BP in normal range    BP Readings from Last 1 Encounters:  03/13/21 130/68         Passed - Valid encounter within last 6 months    Recent Outpatient Visits          3 months ago Pelvic pressure in female   [facility] [place], Coralie Keens, NP

## 2021-07-29 ENCOUNTER — Other Ambulatory Visit: Payer: Self-pay | Admitting: Internal Medicine

## 2021-07-29 DIAGNOSIS — Z1231 Encounter for screening mammogram for malignant neoplasm of breast: Secondary | ICD-10-CM

## 2021-08-12 DIAGNOSIS — M7712 Lateral epicondylitis, left elbow: Secondary | ICD-10-CM | POA: Diagnosis not present

## 2021-08-12 DIAGNOSIS — M79645 Pain in left finger(s): Secondary | ICD-10-CM | POA: Diagnosis not present

## 2021-08-12 DIAGNOSIS — M25522 Pain in left elbow: Secondary | ICD-10-CM | POA: Diagnosis not present

## 2021-08-12 DIAGNOSIS — M13842 Other specified arthritis, left hand: Secondary | ICD-10-CM | POA: Diagnosis not present

## 2021-08-12 DIAGNOSIS — M65312 Trigger thumb, left thumb: Secondary | ICD-10-CM | POA: Diagnosis not present

## 2021-08-22 DIAGNOSIS — H02831 Dermatochalasis of right upper eyelid: Secondary | ICD-10-CM | POA: Diagnosis not present

## 2021-08-29 DIAGNOSIS — M65312 Trigger thumb, left thumb: Secondary | ICD-10-CM | POA: Diagnosis not present

## 2021-08-29 DIAGNOSIS — Z4789 Encounter for other orthopedic aftercare: Secondary | ICD-10-CM | POA: Diagnosis not present

## 2021-08-30 ENCOUNTER — Other Ambulatory Visit: Payer: Self-pay | Admitting: Internal Medicine

## 2021-08-30 DIAGNOSIS — K219 Gastro-esophageal reflux disease without esophagitis: Secondary | ICD-10-CM

## 2021-08-30 NOTE — Telephone Encounter (Signed)
Requested medication (s) are due for refill today: yes (mail order) ? ?Requested medication (s) are on the active medication list: yes ? ?Last refill:  07/05/21 #90 with 0 RF ? ?Future visit scheduled: no, was seen in Nov and asked to return in one month and has not. ? ?Notes to clinic:  Failed protocol of labs within 360 days, labs from 08/2020, just missed deadline for fill, no upcoming appt, please assess. ? ? ? ?  ? ?Requested Prescriptions  ?Pending Prescriptions Disp Refills  ? venlafaxine XR (EFFEXOR-XR) 37.5 MG 24 hr capsule [Pharmacy Med Name: VENLAFAXINE HCL ER 37.5 MG Capsule Extended Release 24 Hour] 90 capsule 0  ?  Sig: TAKE 1 CAPSULE EVERY DAY WITH BREAKFAST  ?  ? Psychiatry: Antidepressants - SNRI - desvenlafaxine & venlafaxine Failed - 08/30/2021  3:28 PM  ?  ?  Failed - Cr in normal range and within 360 days  ?  Creatinine, Ser  ?Date Value Ref Range Status  ?08/21/2020 0.94 0.40 - 1.20 mg/dL Final  ?  ?  ?  ?  Failed - Lipid Panel in normal range within the last 12 months  ?  Cholesterol  ?Date Value Ref Range Status  ?08/21/2020 264 (H) 0 - 200 mg/dL Final  ?  Comment:  ?  ATP III Classification       Desirable:  < 200 mg/dL               Borderline High:  200 - 239 mg/dL          High:  > = 240 mg/dL  ? ?LDL Cholesterol  ?Date Value Ref Range Status  ?08/21/2020 152 (H) 0 - 99 mg/dL Final  ? ?HDL  ?Date Value Ref Range Status  ?08/21/2020 94.60 >39.00 mg/dL Final  ? ?Triglycerides  ?Date Value Ref Range Status  ?08/21/2020 87.0 0.0 - 149.0 mg/dL Final  ?  Comment:  ?  Normal:  <150 mg/dLBorderline High:  150 - 199 mg/dL  ? ?  ?  ?  Passed - Last BP in normal range  ?  BP Readings from Last 1 Encounters:  ?03/13/21 130/68  ?  ?  ?  ?  Passed - Valid encounter within last 6 months  ?  Recent Outpatient Visits   ? ?      ? 5 months ago Pelvic pressure in female  ? [facility] [place], Coralie Keens, NP  ? ?  ?  ? ? ?  ?  ?  ?Signed Prescriptions Disp Refills  ? omeprazole (PRILOSEC) 40 MG  capsule 90 capsule 0  ?  Sig: TAKE 1 CAPSULE EVERY DAY  ?  ? Gastroenterology: Proton Pump Inhibitors Passed - 08/30/2021  3:28 PM  ?  ?  Passed - Valid encounter within last 12 months  ?  Recent Outpatient Visits   ? ?      ? 5 months ago Pelvic pressure in female  ? [facility] [place], Coralie Keens, NP  ? ?  ?  ? ? ?  ?  ?  ? ? ?

## 2021-08-30 NOTE — Telephone Encounter (Signed)
Requested Prescriptions  ?Pending Prescriptions Disp Refills  ?? omeprazole (PRILOSEC) 40 MG capsule [Pharmacy Med Name: OMEPRAZOLE 40 MG Capsule Delayed Release] 90 capsule 0  ?  Sig: TAKE 1 CAPSULE EVERY DAY  ?  ? Gastroenterology: Proton Pump Inhibitors Passed - 08/30/2021  3:28 PM  ?  ?  Passed - Valid encounter within last 12 months  ?  Recent Outpatient Visits   ?      ? 5 months ago Pelvic pressure in female  ? [facility] [place], [place], NP  ?  ?  ? ?  ?  ?  ?? venlafaxine XR (EFFEXOR-XR) 37.5 MG 24 hr capsule [Pharmacy Med Name: VENLAFAXINE HCL ER 37.5 MG Capsule Extended Release 24 Hour] 90 capsule 0  ?  Sig: TAKE 1 CAPSULE EVERY DAY WITH BREAKFAST  ?  ? Psychiatry: Antidepressants - SNRI - desvenlafaxine & venlafaxine Failed - 08/30/2021  3:28 PM  ?  ?  Failed - Cr in normal range and within 360 days  ?  Creatinine, Ser  ?Date Value Ref Range Status  ?08/21/2020 0.94 0.40 - 1.20 mg/dL Final  ?   ?  ?  Failed - Lipid Panel in normal range within the last 12 months  ?  Cholesterol  ?Date Value Ref Range Status  ?08/21/2020 264 (H) 0 - 200 mg/dL Final  ?  Comment:  ?  ATP III Classification       Desirable:  < 200 mg/dL               Borderline High:  200 - 239 mg/dL          High:  > = 240 mg/dL  ? ?LDL Cholesterol  ?Date Value Ref Range Status  ?08/21/2020 152 (H) 0 - 99 mg/dL Final  ? ?HDL  ?Date Value Ref Range Status  ?08/21/2020 94.60 >39.00 mg/dL Final  ? ?Triglycerides  ?Date Value Ref Range Status  ?08/21/2020 87.0 0.0 - 149.0 mg/dL Final  ?  Comment:  ?  Normal:  <150 mg/dLBorderline High:  150 - 199 mg/dL  ? ?  ?  ?  Passed - Last BP in normal range  ?  BP Readings from Last 1 Encounters:  ?03/13/21 130/68  ?   ?  ?  Passed - Valid encounter within last 6 months  ?  Recent Outpatient Visits   ?      ? 5 months ago Pelvic pressure in female  ? [facility] [place], Coralie Keens, NP  ?  ?  ? ?  ?  ?  ? ? ?

## 2021-09-05 ENCOUNTER — Other Ambulatory Visit: Payer: Self-pay | Admitting: Internal Medicine

## 2021-09-05 NOTE — Telephone Encounter (Signed)
Requested medication (s) are due for refill today-yes ? ?Requested medication (s) are on the active medication list -yes ? ?Future visit scheduled -no ? ?Last refill: 04/02/21 #90 ? ?Notes to clinic: Request RF: fails lab protocol-over 1 year ? ?Requested Prescriptions  ?Pending Prescriptions Disp Refills  ? levothyroxine (SYNTHROID) 75 MCG tablet [Pharmacy Med Name: LEVOTHYROXINE SODIUM 75 MCG Tablet] 90 tablet 0  ?  Sig: TAKE 1 TABLET EVERY DAY BEFORE BREAKFAST  ?  ? Endocrinology:  Hypothyroid Agents Failed - 09/05/2021  3:33 AM  ?  ?  Failed - TSH in normal range and within 360 days  ?  TSH  ?Date Value Ref Range Status  ?08/21/2020 1.89 0.35 - 4.50 uIU/mL Final  ?  ?  ?  ?  Passed - Valid encounter within last 12 months  ?  Recent Outpatient Visits   ? ?      ? 5 months ago Pelvic pressure in female  ? [facility] [place], Coralie Keens, NP  ? ?  ?  ? ? ?  ?  ?  ? ? ? ?Requested Prescriptions  ?Pending Prescriptions Disp Refills  ? levothyroxine (SYNTHROID) 75 MCG tablet [Pharmacy Med Name: LEVOTHYROXINE SODIUM 75 MCG Tablet] 90 tablet 0  ?  Sig: TAKE 1 TABLET EVERY DAY BEFORE BREAKFAST  ?  ? Endocrinology:  Hypothyroid Agents Failed - 09/05/2021  3:33 AM  ?  ?  Failed - TSH in normal range and within 360 days  ?  TSH  ?Date Value Ref Range Status  ?08/21/2020 1.89 0.35 - 4.50 uIU/mL Final  ?  ?  ?  ?  Passed - Valid encounter within last 12 months  ?  Recent Outpatient Visits   ? ?      ? 5 months ago Pelvic pressure in female  ? [facility] [place], Coralie Keens, NP  ? ?  ?  ? ? ?  ?  ?  ? ? ? ?

## 2021-09-09 ENCOUNTER — Ambulatory Visit
Admission: RE | Admit: 2021-09-09 | Discharge: 2021-09-09 | Disposition: A | Discharge status: Home / Self Care | Payer: Medicare HMO | Source: Ambulatory Visit | Attending: Internal Medicine | Admitting: Internal Medicine

## 2021-09-09 DIAGNOSIS — Z1231 Encounter for screening mammogram for malignant neoplasm of breast: Secondary | ICD-10-CM | POA: Insufficient documentation

## 2021-09-12 DIAGNOSIS — M25522 Pain in left elbow: Secondary | ICD-10-CM | POA: Diagnosis not present

## 2021-09-12 DIAGNOSIS — M79642 Pain in left hand: Secondary | ICD-10-CM | POA: Diagnosis not present

## 2021-09-16 DIAGNOSIS — M25522 Pain in left elbow: Secondary | ICD-10-CM | POA: Diagnosis not present

## 2021-09-16 DIAGNOSIS — M79642 Pain in left hand: Secondary | ICD-10-CM | POA: Diagnosis not present

## 2021-09-26 DIAGNOSIS — M7712 Lateral epicondylitis, left elbow: Secondary | ICD-10-CM | POA: Diagnosis not present

## 2021-09-26 DIAGNOSIS — M79642 Pain in left hand: Secondary | ICD-10-CM | POA: Diagnosis not present

## 2021-09-26 DIAGNOSIS — M25522 Pain in left elbow: Secondary | ICD-10-CM | POA: Diagnosis not present

## 2021-11-20 DIAGNOSIS — M7712 Lateral epicondylitis, left elbow: Secondary | ICD-10-CM | POA: Diagnosis not present

## 2021-12-02 DIAGNOSIS — M545 Low back pain, unspecified: Secondary | ICD-10-CM | POA: Diagnosis not present

## 2021-12-02 DIAGNOSIS — M5412 Radiculopathy, cervical region: Secondary | ICD-10-CM | POA: Diagnosis not present

## 2021-12-02 DIAGNOSIS — M9903 Segmental and somatic dysfunction of lumbar region: Secondary | ICD-10-CM | POA: Diagnosis not present

## 2021-12-02 DIAGNOSIS — M9901 Segmental and somatic dysfunction of cervical region: Secondary | ICD-10-CM | POA: Diagnosis not present

## 2021-12-04 DIAGNOSIS — M9901 Segmental and somatic dysfunction of cervical region: Secondary | ICD-10-CM | POA: Diagnosis not present

## 2021-12-04 DIAGNOSIS — M545 Low back pain, unspecified: Secondary | ICD-10-CM | POA: Diagnosis not present

## 2021-12-04 DIAGNOSIS — M9903 Segmental and somatic dysfunction of lumbar region: Secondary | ICD-10-CM | POA: Diagnosis not present

## 2021-12-04 DIAGNOSIS — M5412 Radiculopathy, cervical region: Secondary | ICD-10-CM | POA: Diagnosis not present

## 2021-12-06 DIAGNOSIS — M5412 Radiculopathy, cervical region: Secondary | ICD-10-CM | POA: Diagnosis not present

## 2021-12-06 DIAGNOSIS — M545 Low back pain, unspecified: Secondary | ICD-10-CM | POA: Diagnosis not present

## 2021-12-06 DIAGNOSIS — M9903 Segmental and somatic dysfunction of lumbar region: Secondary | ICD-10-CM | POA: Diagnosis not present

## 2021-12-06 DIAGNOSIS — M9901 Segmental and somatic dysfunction of cervical region: Secondary | ICD-10-CM | POA: Diagnosis not present

## 2021-12-09 DIAGNOSIS — M9903 Segmental and somatic dysfunction of lumbar region: Secondary | ICD-10-CM | POA: Diagnosis not present

## 2021-12-09 DIAGNOSIS — M5412 Radiculopathy, cervical region: Secondary | ICD-10-CM | POA: Diagnosis not present

## 2021-12-09 DIAGNOSIS — M9901 Segmental and somatic dysfunction of cervical region: Secondary | ICD-10-CM | POA: Diagnosis not present

## 2021-12-09 DIAGNOSIS — M545 Low back pain, unspecified: Secondary | ICD-10-CM | POA: Diagnosis not present

## 2021-12-10 HISTORY — PX: BLEPHAROPLASTY: SUR158

## 2021-12-20 DIAGNOSIS — H57813 Brow ptosis, bilateral: Secondary | ICD-10-CM | POA: Diagnosis not present

## 2021-12-20 DIAGNOSIS — F900 Attention-deficit hyperactivity disorder, predominantly inattentive type: Secondary | ICD-10-CM | POA: Diagnosis not present

## 2021-12-20 DIAGNOSIS — Z885 Allergy status to narcotic agent status: Secondary | ICD-10-CM | POA: Diagnosis not present

## 2021-12-20 DIAGNOSIS — Z882 Allergy status to sulfonamides status: Secondary | ICD-10-CM | POA: Diagnosis not present

## 2021-12-20 DIAGNOSIS — H02831 Dermatochalasis of right upper eyelid: Secondary | ICD-10-CM | POA: Diagnosis not present

## 2021-12-20 DIAGNOSIS — H02834 Dermatochalasis of left upper eyelid: Secondary | ICD-10-CM | POA: Diagnosis not present

## 2021-12-20 DIAGNOSIS — Z79899 Other long term (current) drug therapy: Secondary | ICD-10-CM | POA: Diagnosis not present

## 2021-12-20 DIAGNOSIS — Z880 Allergy status to penicillin: Secondary | ICD-10-CM | POA: Diagnosis not present

## 2022-01-23 ENCOUNTER — Ambulatory Visit: Payer: Self-pay

## 2022-01-23 NOTE — Telephone Encounter (Signed)
  Chief Complaint: COVID + Symptoms: Very sore throat, cough,  Fatigue, unsettled stomach Frequency: This morning Pertinent Negatives: Patient denies  Disposition: '[]'$ ED /'[]'$ Urgent Care (no appt availability in office) / '[]'$ Appointment(In office/virtual)/ '[x]'$  [place] [facility]/ '[]'$ Home Care/ '[]'$ Refused Recommended Disposition /'[]'$ [facility] [facility]/ '[]'$  Follow-up with PCP Additional Notes: PT was on vacation on the [facility] and was experiencing "allergy" symptoms. This morning pt awoke to a very sore throat, a hacking dry cough , fatigue, her stomach feels unsettled and her chest feels "off".  PT tested + for COVID this morning. PT would like to discuss anti viral medication risks vs benefits. PT may also request cough medicine.   Summary: Covid positive and discuss medicatio   Pt reports that she tested positive for Covid and would like to speak with someone regarding the Covid medication. Cb# [id number]      Reason for Disposition  [1] HIGH RISK patient (e.g., weak immune system, age > 30 years, obesity with BMI 30 or higher, pregnant, chronic lung disease or other chronic medical condition) AND [2] COVID symptoms (e.g., cough, fever)  (Exceptions: Already seen by PCP and no new or worsening symptoms.)  Answer Assessment - Initial Assessment Questions 1. COVID-19 DIAGNOSIS: "How do you know that you have COVID?" (e.g., positive lab test or self-test, diagnosed by doctor or NP/PA, symptoms after exposure).     Lab test - allergy like S/s. 2. COVID-19 EXPOSURE: "Was there any known exposure to COVID before the symptoms began?" CDC Definition of close contact: within 6 feet (2 meters) for a total of 15 minutes or more over a 24-hour period.       3. ONSET: "When did the COVID-19 symptoms start?"      Last night 4. WORST SYMPTOM: "What is your worst symptom?" (e.g., cough, fever, shortness of breath, muscle aches)     Cough, chest is a little off, sore throat 5. COUGH: "Do you have a  cough?" If Yes, ask: "How bad is the cough?"       Dry hacking cough 6. FEVER: "Do you have a fever?" If Yes, ask: "What is your temperature, how was it measured, and when did it start?"     Unsure - felt warm the other night. Probably last night. 7. RESPIRATORY STATUS: "Describe your breathing?" (e.g., normal; shortness of breath, wheezing, unable to speak)      cough 8. BETTER-SAME-WORSE: "Are you getting better, staying the same or getting worse compared to yesterday?"  If getting worse, ask, "In what way?"     worse 9. OTHER SYMPTOMS: "Do you have any other symptoms?"  (e.g., chills, fatigue, headache, loss of smell or taste, muscle pain, sore throat)     Stomach unsettled 10. HIGH RISK DISEASE: "Do you have any chronic medical problems?" (e.g., asthma, heart or lung disease, weak immune system, obesity, etc.)        11. VACCINE: "Have you had the COVID-19 vaccine?" If Yes, ask: "Which one, how many shots, when did you get it?"        12. Pregnancy: "Is there any chance you are pregnant?" "When was your last menstrual period?"        13. O2 SATURATION MONITOR:  "Do you use an oxygen saturation monitor (pulse oximeter) at home?" If Yes, ask "What is your reading (oxygen level) today?" "What is your usual oxygen saturation reading?" (e.g., 95%)  Protocols used: Coronavirus (COVID-19) Diagnosed or Suspected-A-AH

## 2022-01-24 ENCOUNTER — Ambulatory Visit: Payer: Self-pay | Admitting: *Deleted

## 2022-01-24 ENCOUNTER — Telehealth: Payer: Medicare HMO | Admitting: Emergency Medicine

## 2022-01-24 DIAGNOSIS — U071 COVID-19: Secondary | ICD-10-CM

## 2022-01-24 MED ORDER — BENZONATATE 100 MG PO CAPS: 100.0000 mg | ORAL_CAPSULE | Freq: Two times a day (BID) | ORAL | 0 refills | Status: DC | PRN

## 2022-01-24 MED ORDER — MOLNUPIRAVIR EUA 200MG CAPSULE: 4.0000 | ORAL_CAPSULE | Freq: Two times a day (BID) | ORAL | 0 refills | Status: AC

## 2022-01-24 MED ORDER — ALBUTEROL SULFATE HFA 108 (90 BASE) MCG/ACT IN AERS
2.0000 | INHALATION_SPRAY | RESPIRATORY_TRACT | 0 refills | Status: DC | PRN
Start: 2022-01-24 — End: 2022-03-27

## 2022-01-24 NOTE — Telephone Encounter (Signed)
Called patient to review sx. Patient triaged yesterday and set up UC VV. Patient reports she is feeling worse today. Patient is on day 4 of covid sx. Patient reports no one called her back regarding VV. Patient was able to set up UC VV today at 11 and will call back if visit was not successful. Reviewed covid isolation precautions. Patient verbalized understanding. Patient will call back if UC VV not completed.

## 2022-01-24 NOTE — Telephone Encounter (Signed)
Pt advised. She has a virtual visit scheduled for 11am today.   Thanks,   -Mickel Baas

## 2022-01-24 NOTE — Telephone Encounter (Signed)
Recommend virtual visit through her mychart with another [facility] provider. I already have 21 pts on my schedule.

## 2022-01-24 NOTE — Patient Instructions (Signed)
     Please continue isolation at home, for at least 5 days since the start of your symptoms and until you have had 24 hours with no fever (without taking a fever reducer) and with improving of symptoms.  If you have no symptoms but tested positive (or all symptoms resolve after 5 days and you have no fever) you can leave your house but continue to wear a mask around others for an additional 5 days. If you have a fever,continue to stay home until you have had 24 hours of no fever. Most cases improve 5-10 days from onset but we have seen a small number of patients who have gotten worse after the 10 days.  Please be sure to watch for worsening symptoms and remain taking the proper precautions.   Go to the nearest hospital ED for assessment if fever/cough/breathlessness are severe or illness seems like a threat to life.    The following symptoms may appear 2-14 days after exposure: Fever Cough Shortness of breath or difficulty breathing Chills Repeated shaking with chills Muscle pain Headache Sore throat New loss of taste or smell Fatigue Congestion or runny nose Nausea or vomiting Diarrhea  You have been enrolled in [facility] for COVID-19. Daily you will receive a questionnaire within the [facility] website. Our COVID-19 response team will be monitoring your responses daily.  You can use medication such as prescription cough medication called Tessalon Perles 100 mg. You may take 1-2 capsules every 8 hours as needed for cough and  prescription inhaler called Albuterol MDI 90 mcg /actuation 2 puffs every 4 hours as needed for shortness of breath, wheezing, cough  You may also take acetaminophen (Tylenol) as needed for fever.  HOME CARE: Only take medications as instructed by your medical team. Drink plenty of fluids and get plenty of rest. A steam or ultrasonic humidifier can help if you have congestion.   GET HELP RIGHT AWAY IF YOU HAVE EMERGENCY WARNING SIGNS.  Call 911 or  proceed to your closest emergency facility if: You develop worsening high fever. Trouble breathing Bluish lips or face Persistent pain or pressure in the chest New confusion Inability to wake or stay awake You cough up blood. Your symptoms become more severe Inability to hold down food or fluids  This list is not all possible symptoms. Contact your medical provider for any symptoms that are severe or concerning to you.

## 2022-01-24 NOTE — Progress Notes (Signed)
Virtual Visit Consent   Sarah Jacobson, you are scheduled for a virtual visit with a [facility] provider today. Just as with appointments in the office, your consent must be obtained to participate. Your consent will be active for this visit and any virtual visit you may have with one of our providers in the next 365 days. If you have a MyChart account, a copy of this consent can be sent to you electronically.  As this is a virtual visit, video technology does not allow for your provider to perform a traditional examination. This may limit your provider's ability to fully assess your condition. If your provider identifies any concerns that need to be evaluated in person or the need to arrange testing (such as labs, EKG, etc.), we will make arrangements to do so. Although advances in technology are sophisticated, we cannot ensure that it will always work on either your end or our end. If the connection with a video visit is poor, the visit may have to be switched to a telephone visit. With either a video or telephone visit, we are not always able to ensure that we have a secure connection.  By engaging in this virtual visit, you consent to the provision of healthcare and authorize for your insurance to be billed (if applicable) for the services provided during this visit. Depending on your insurance coverage, you may receive a charge related to this service.  I need to obtain your verbal consent now. Are you willing to proceed with your visit today? Sarah Jacobson has provided verbal consent on 01/24/2070 for a virtual visit (video or telephone). Montine Circle, PA-C  Date: 01/24/2022 11:10 AM  Virtual Visit via Video Note   I, Montine Circle, connected with  Sarah Jacobson  ([id number], 71-Mar-1952) on 01/24/22 at 11:00 AM EDT by a video-enabled telemedicine application and verified that I am speaking with the correct person using two identifiers.  Location: Patient: Virtual Visit Location Patient:  Home Provider: Virtual Visit Location Provider: Home   I discussed the limitations of evaluation and management by telemedicine and the availability of in person appointments. The patient expressed understanding and agreed to proceed.    History of Present Illness: Sarah Jacobson is a 71 y.o. who identifies as a female who was assigned female at birth, and is being seen today for sore throat.  States that she had a positive covid test.  Reports associated sick contacts.  States that the sore throat is her worst symptoms.  Reports mild chest tightness from congestion, but no pain or SOB.  Denies high fever.  HPI: HPI  Problems:  Patient Active Problem List   Diagnosis Date Noted   Prediabetes 03/13/2021   Osteopenia 03/13/2021   Class 1 obesity due to excess calories with body mass index (BMI) of 31.0 to 31.9 in adult 03/13/2021   History of cold sores 08/21/2020   GERD (gastroesophageal reflux disease) 07/07/2019   Hyperlipidemia 10/04/2015   Generalized anxiety disorder 10/13/2014   Hypothyroidism 10/13/2014    Allergies:  Allergies  Allergen Reactions   Sulfa Antibiotics Hives   Codeine Nausea And Vomiting   Penicillins Hives   Medications:  Current Outpatient Medications:    albuterol (VENTOLIN HFA) 108 (90 Base) MCG/ACT inhaler, Inhale 2 puffs into the lungs every 4 (four) hours as needed for wheezing or shortness of breath., Disp: 1 each, Rfl: 0   benzonatate (TESSALON) 100 MG capsule, Take 1 capsule (100 mg total) by mouth 2 (two)  times daily as needed for cough., Disp: 20 capsule, Rfl: 0   molnupiravir EUA (LAGEVRIO) 200 mg CAPS capsule, Take 4 capsules (800 mg total) by mouth 2 (two) times daily for 5 days., Disp: 40 capsule, Rfl: 0   acyclovir ointment (ZOVIRAX) 5 %, Apply 1 application topically in the morning, at noon, and at bedtime., Disp: 15 g, Rfl: 0   b complex vitamins tablet, Take 1 tablet by mouth daily., Disp: , Rfl:    Calcium Carb-Cholecalciferol (CALCIUM 600 +  D PO), Take 1 tablet by mouth daily., Disp: , Rfl:    Cholecalciferol (VITAMIN D) 2000 UNITS tablet, Take 2,000 Units by mouth daily., Disp: , Rfl:    levothyroxine (SYNTHROID) 75 MCG tablet, TAKE 1 TABLET EVERY DAY BEFORE BREAKFAST, Disp: 90 tablet, Rfl: 0   Misc Natural Products (COSAMIN ASU ADVANCED FORMULA PO), Take 1 tablet by mouth daily., Disp: , Rfl:    Multiple Vitamin (MULTI VITAMIN DAILY PO), Take 1 tablet by mouth daily., Disp: , Rfl:    omeprazole (PRILOSEC) 40 MG capsule, TAKE 1 CAPSULE EVERY DAY, Disp: 90 capsule, Rfl: 0   phentermine 37.5 MG capsule, Take 1 capsule (37.5 mg total) by mouth every morning., Disp: 30 capsule, Rfl: 0   Probiotic Product (PROBIOTIC DAILY) CAPS, Take 1 capsule by mouth daily., Disp: , Rfl:    Specialty Vitamins Products (MAGNESIUM, AMINO ACID CHELATE,) 133 MG tablet, Take 1 tablet by mouth daily., Disp: , Rfl:    valACYclovir (VALTREX) 1000 MG tablet, TAKE 1 TABLET (1,000 MG TOTAL) BY MOUTH DAILY. (Patient taking differently: Take 1,000 mg by mouth daily as needed.), Disp: 90 tablet, Rfl: 0   venlafaxine XR (EFFEXOR-XR) 37.5 MG 24 hr capsule, TAKE 1 CAPSULE EVERY DAY WITH BREAKFAST, Disp: 90 capsule, Rfl: 0  Observations/Objective: Patient is well-developed, well-nourished in no acute distress.  Resting comfortably  at home.  Head is normocephalic, atraumatic.  No labored breathing.  Speech is clear and coherent with logical content.  Patient is alert and oriented at baseline.    Assessment and Plan: 1. COVID-19   Molnupiravir MDI Tessalon Supportive care  Follow Up Instructions: I discussed the assessment and treatment plan with the patient. The patient was provided an opportunity to ask questions and all were answered. The patient agreed with the plan and demonstrated an understanding of the instructions.  A copy of instructions were sent to the patient via MyChart unless otherwise noted below.     The patient was advised to call back  or seek an in-person evaluation if the symptoms worsen or if the condition fails to improve as anticipated.  Time:  I spent 10 minutes with the patient via telehealth technology discussing the above problems/concerns.    Montine Circle, PA-C

## 2022-01-24 NOTE — Telephone Encounter (Signed)
Called patient to review covid positive sx. See addended note. Sx worse and feels pain with swallowing  from sore throat and chest tightness no difficulty breathing, no fever. Patient awaiting UC VV.

## 2022-01-26 ENCOUNTER — Other Ambulatory Visit: Payer: Self-pay | Admitting: Internal Medicine

## 2022-01-26 DIAGNOSIS — K219 Gastro-esophageal reflux disease without esophagitis: Secondary | ICD-10-CM

## 2022-01-27 NOTE — Telephone Encounter (Signed)
Requested Prescriptions  Pending Prescriptions Disp Refills  . omeprazole (PRILOSEC) 40 MG capsule [Pharmacy Med Name: OMEPRAZOLE 40 MG Capsule Delayed Release] 90 capsule 0    Sig: TAKE 1 CAPSULE EVERY DAY     Gastroenterology: Proton Pump Inhibitors Passed - 01/26/2022  4:39 AM      Passed - Valid encounter within last 12 months    Recent Outpatient Visits          10 months ago Pelvic pressure in female   [facility] [place], Coralie Keens, NP      Future Appointments            In 3 weeks Garnette Gunner, Coralie Keens, NP [facility], [place]           . venlafaxine XR (EFFEXOR-XR) 37.5 MG 24 hr capsule [Pharmacy Med Name: VENLAFAXINE HCL ER 37.5 MG Capsule Extended Release 24 Hour] 90 capsule 0    Sig: TAKE 1 CAPSULE EVERY DAY WITH BREAKFAST     Psychiatry: Antidepressants - SNRI - desvenlafaxine & venlafaxine Failed - 01/26/2022  4:39 AM      Failed - Cr in normal range and within 360 days    Creatinine, Ser  Date Value Ref Range Status  08/21/2020 0.94 0.40 - 1.20 mg/dL Final         Failed - Valid encounter within last 6 months    Recent Outpatient Visits          10 months ago Pelvic pressure in female   [facility], Coralie Keens, NP      Future Appointments            In 3 weeks [place], Coralie Keens, NP [facility]           Failed - Lipid Panel in normal range within the last 12 months    Cholesterol  Date Value Ref Range Status  08/21/2020 264 (H) 0 - 200 mg/dL Final    Comment:    ATP III Classification       Desirable:  < 200 mg/dL               Borderline High:  200 - 239 mg/dL          High:  > = 240 mg/dL   LDL Cholesterol  Date Value Ref Range Status  08/21/2020 152 (H) 0 - 99 mg/dL Final   HDL  Date Value Ref Range Status  08/21/2020 94.60 >39.00 mg/dL Final   Triglycerides  Date Value Ref Range Status  08/21/2020 87.0 0.0 - 149.0 mg/dL Final    Comment:    Normal:  <150 mg/dLBorderline High:   150 - 199 mg/dL         Passed - Last BP in normal range    BP Readings from Last 1 Encounters:  03/13/21 130/68

## 2022-01-27 NOTE — Telephone Encounter (Signed)
Requested medication (s) are due for refill today: yes  Requested medication (s) are on the active medication list: yes  Last refill:  08/30/21 #90 with 0 RF  Future visit scheduled: 02/17/22  Notes to clinic:  Failed protocol of labs within 12 months, labs from 08/2020, has upcoming appt, please assess.       Requested Prescriptions  Pending Prescriptions Disp Refills   venlafaxine XR (EFFEXOR-XR) 37.5 MG 24 hr capsule [Pharmacy Med Name: VENLAFAXINE HCL ER 37.5 MG Capsule Extended Release 24 Hour] 90 capsule 0    Sig: TAKE 1 CAPSULE EVERY DAY WITH BREAKFAST     Psychiatry: Antidepressants - SNRI - desvenlafaxine & venlafaxine Failed - 01/26/2022  4:39 AM      Failed - Cr in normal range and within 360 days    Creatinine, Ser  Date Value Ref Range Status  08/21/2020 0.94 0.40 - 1.20 mg/dL Final         Failed - Valid encounter within last 6 months    Recent Outpatient Visits           10 months ago Pelvic pressure in female   [facility], Coralie Keens, NP       Future Appointments             In 3 weeks [place], Coralie Keens, NP [facility]            Failed - Lipid Panel in normal range within the last 12 months    Cholesterol  Date Value Ref Range Status  08/21/2020 264 (H) 0 - 200 mg/dL Final    Comment:    ATP III Classification       Desirable:  < 200 mg/dL               Borderline High:  200 - 239 mg/dL          High:  > = 240 mg/dL   LDL Cholesterol  Date Value Ref Range Status  08/21/2020 152 (H) 0 - 99 mg/dL Final   HDL  Date Value Ref Range Status  08/21/2020 94.60 >39.00 mg/dL Final   Triglycerides  Date Value Ref Range Status  08/21/2020 87.0 0.0 - 149.0 mg/dL Final    Comment:    Normal:  <150 mg/dLBorderline High:  150 - 199 mg/dL         Passed - Last BP in normal range    BP Readings from Last 1 Encounters:  03/13/21 130/68         Signed Prescriptions Disp Refills   omeprazole (PRILOSEC) 40 MG  capsule 90 capsule 0    Sig: TAKE [place]     Gastroenterology: Proton Pump Inhibitors Passed - 01/26/2022  4:39 AM      Passed - Valid encounter within last 12 months    Recent Outpatient Visits           10 months ago Pelvic pressure in female   [place], NP       Future Appointments             In 3 weeks Garnette Gunner, Coralie Keens, NP [facility], [facility]

## 2022-02-03 ENCOUNTER — Telehealth: Payer: Self-pay | Admitting: Internal Medicine

## 2022-02-03 NOTE — Telephone Encounter (Signed)
Inbound call from patient stating that yesterday she developed a  thrombosed hemorrhoid and is seeking advice on what she needs to do before it gets bigger. Please advise.

## 2022-02-03 NOTE — Telephone Encounter (Signed)
Returned call to patient. I left patient a detailed vm informing her that we are not able to acutely treat a thrombosed hemorrhoid so she will need to be assessed at Urgent Care or the ED depending on the severity of her symptoms.

## 2022-02-04 DIAGNOSIS — K645 Perianal venous thrombosis: Secondary | ICD-10-CM | POA: Diagnosis not present

## 2022-02-04 DIAGNOSIS — K644 Residual hemorrhoidal skin tags: Secondary | ICD-10-CM | POA: Diagnosis not present

## 2022-02-11 ENCOUNTER — Other Ambulatory Visit: Payer: Self-pay | Admitting: Internal Medicine

## 2022-02-12 NOTE — Telephone Encounter (Signed)
   Requested Prescriptions  Pending Prescriptions Disp Refills   levothyroxine (SYNTHROID) 75 MCG tablet [Pharmacy Med Name: LEVOTHYROXINE SODIUM 75 MCG Tablet] 90 tablet 0    Sig: TAKE 1 TABLET EVERY DAY BEFORE BREAKFAST     There is no refill protocol information for this order

## 2022-02-12 NOTE — Telephone Encounter (Signed)
Requested medication (s) are due for refill today: yes  Requested medication (s) are on the active medication list: yes  Last refill:  04/02/21 #90 with 0 RF  Future visit scheduled: 02/17/22  Notes to clinic:  Failed protocol of labs within 12 months, 08/2020, has upcoming appt, please assess.       Requested Prescriptions  Pending Prescriptions Disp Refills   levothyroxine (SYNTHROID) 75 MCG tablet [Pharmacy Med Name: LEVOTHYROXINE SODIUM 75 MCG Tablet] 90 tablet 0    Sig: TAKE 1 TABLET EVERY DAY BEFORE BREAKFAST     There is no refill protocol information for this order

## 2022-02-17 ENCOUNTER — Encounter: Payer: Medicare HMO | Admitting: Internal Medicine

## 2022-02-18 ENCOUNTER — Telehealth: Payer: Self-pay | Admitting: Internal Medicine

## 2022-02-18 NOTE — Telephone Encounter (Signed)
Left message for patient to call back and schedule the Medicare Annual Wellness Visit (AWV) virtually or by telephone.  Last AWV 08/21/20  Please schedule at anytime with [place].   Any questions, please call me at [phone]

## 2022-03-03 DIAGNOSIS — M9901 Segmental and somatic dysfunction of cervical region: Secondary | ICD-10-CM | POA: Diagnosis not present

## 2022-03-03 DIAGNOSIS — M5412 Radiculopathy, cervical region: Secondary | ICD-10-CM | POA: Diagnosis not present

## 2022-03-03 DIAGNOSIS — M9902 Segmental and somatic dysfunction of thoracic region: Secondary | ICD-10-CM | POA: Diagnosis not present

## 2022-03-03 DIAGNOSIS — M5414 Radiculopathy, thoracic region: Secondary | ICD-10-CM | POA: Diagnosis not present

## 2022-03-04 DIAGNOSIS — M5412 Radiculopathy, cervical region: Secondary | ICD-10-CM | POA: Diagnosis not present

## 2022-03-04 DIAGNOSIS — M9901 Segmental and somatic dysfunction of cervical region: Secondary | ICD-10-CM | POA: Diagnosis not present

## 2022-03-04 DIAGNOSIS — M9902 Segmental and somatic dysfunction of thoracic region: Secondary | ICD-10-CM | POA: Diagnosis not present

## 2022-03-04 DIAGNOSIS — M5414 Radiculopathy, thoracic region: Secondary | ICD-10-CM | POA: Diagnosis not present

## 2022-03-10 DIAGNOSIS — M5412 Radiculopathy, cervical region: Secondary | ICD-10-CM | POA: Diagnosis not present

## 2022-03-10 DIAGNOSIS — M5414 Radiculopathy, thoracic region: Secondary | ICD-10-CM | POA: Diagnosis not present

## 2022-03-10 DIAGNOSIS — M9902 Segmental and somatic dysfunction of thoracic region: Secondary | ICD-10-CM | POA: Diagnosis not present

## 2022-03-10 DIAGNOSIS — M9901 Segmental and somatic dysfunction of cervical region: Secondary | ICD-10-CM | POA: Diagnosis not present

## 2022-03-12 DIAGNOSIS — M9902 Segmental and somatic dysfunction of thoracic region: Secondary | ICD-10-CM | POA: Diagnosis not present

## 2022-03-12 DIAGNOSIS — M5412 Radiculopathy, cervical region: Secondary | ICD-10-CM | POA: Diagnosis not present

## 2022-03-12 DIAGNOSIS — M5414 Radiculopathy, thoracic region: Secondary | ICD-10-CM | POA: Diagnosis not present

## 2022-03-12 DIAGNOSIS — M9901 Segmental and somatic dysfunction of cervical region: Secondary | ICD-10-CM | POA: Diagnosis not present

## 2022-03-21 DIAGNOSIS — M9901 Segmental and somatic dysfunction of cervical region: Secondary | ICD-10-CM | POA: Diagnosis not present

## 2022-03-21 DIAGNOSIS — M9902 Segmental and somatic dysfunction of thoracic region: Secondary | ICD-10-CM | POA: Diagnosis not present

## 2022-03-21 DIAGNOSIS — M5414 Radiculopathy, thoracic region: Secondary | ICD-10-CM | POA: Diagnosis not present

## 2022-03-21 DIAGNOSIS — M5412 Radiculopathy, cervical region: Secondary | ICD-10-CM | POA: Diagnosis not present

## 2022-03-24 DIAGNOSIS — K644 Residual hemorrhoidal skin tags: Secondary | ICD-10-CM | POA: Diagnosis not present

## 2022-03-24 DIAGNOSIS — K642 Third degree hemorrhoids: Secondary | ICD-10-CM | POA: Diagnosis not present

## 2022-03-26 ENCOUNTER — Encounter: Payer: Medicare HMO | Admitting: Internal Medicine

## 2022-03-26 DIAGNOSIS — M7542 Impingement syndrome of left shoulder: Secondary | ICD-10-CM | POA: Diagnosis not present

## 2022-03-26 DIAGNOSIS — M25512 Pain in left shoulder: Secondary | ICD-10-CM | POA: Diagnosis not present

## 2022-03-26 DIAGNOSIS — M542 Cervicalgia: Secondary | ICD-10-CM | POA: Diagnosis not present

## 2022-03-26 DIAGNOSIS — M47812 Spondylosis without myelopathy or radiculopathy, cervical region: Secondary | ICD-10-CM | POA: Diagnosis not present

## 2022-03-27 ENCOUNTER — Encounter: Payer: Self-pay | Admitting: Internal Medicine

## 2022-03-27 ENCOUNTER — Ambulatory Visit (INDEPENDENT_AMBULATORY_CARE_PROVIDER_SITE_OTHER): Payer: Medicare HMO | Admitting: Internal Medicine

## 2022-03-27 VITALS — BP 134/84 | HR 80 | Temp 96.9°F | Ht 67.0 in | Wt 193.0 lb

## 2022-03-27 DIAGNOSIS — Z6830 Body mass index (BMI) 30.0-30.9, adult: Secondary | ICD-10-CM | POA: Diagnosis not present

## 2022-03-27 DIAGNOSIS — R7303 Prediabetes: Secondary | ICD-10-CM

## 2022-03-27 DIAGNOSIS — Z23 Encounter for immunization: Secondary | ICD-10-CM | POA: Diagnosis not present

## 2022-03-27 DIAGNOSIS — M8589 Other specified disorders of bone density and structure, multiple sites: Secondary | ICD-10-CM

## 2022-03-27 DIAGNOSIS — E039 Hypothyroidism, unspecified: Secondary | ICD-10-CM | POA: Diagnosis not present

## 2022-03-27 DIAGNOSIS — E6609 Other obesity due to excess calories: Secondary | ICD-10-CM

## 2022-03-27 DIAGNOSIS — Z0001 Encounter for general adult medical examination with abnormal findings: Secondary | ICD-10-CM | POA: Diagnosis not present

## 2022-03-27 NOTE — Assessment & Plan Note (Signed)
Encourage diet and exercise weight loss 

## 2022-03-27 NOTE — Progress Notes (Signed)
Subjective:    Patient ID: Sarah Jacobson, female    DOB: 18-Oct-1950, 71 y.o.   MRN: [id number]  HPI  Patient presents to clinic today for her annual exam.  Flu: 03/2021 Tetanus: > 10 years ago COVID: [place] x2 Pneumovax: 04/2018 Prevnar: 03/2017 Shingrix: 09/2020, 03/2021 Pap smear: Hysterectomy Mammogram: 09/2021 Bone density: 06/2018 Colon screening: 01/2018 Vision screening: annually Dentist: biannually  Diet: She does eat meat. She consumes some fruits and veggies. She does eat some fried foods. She drinks mostly sweet tea, water, wine. Exercise: None   Review of Systems     Past Medical History:  Diagnosis Date   Anxiety    Complication of anesthesia    COVID-19    External hemorrhoids    GERD (gastroesophageal reflux disease)    Hyperlipidemia    Hypothyroidism    Internal hemorrhoids    PONV (postoperative nausea and vomiting)    Thyroid disease     Current Outpatient Medications  Medication Sig Dispense Refill   acyclovir ointment (ZOVIRAX) 5 % Apply 1 application topically in the morning, at noon, and at bedtime. 15 g 0   albuterol (VENTOLIN HFA) 108 (90 Base) MCG/ACT inhaler Inhale 2 puffs into the lungs every 4 (four) hours as needed for wheezing or shortness of breath. 1 each 0   b complex vitamins tablet Take 1 tablet by mouth daily.     benzonatate (TESSALON) 100 MG capsule Take 1 capsule (100 mg total) by mouth 2 (two) times daily as needed for cough. 20 capsule 0   Calcium Carb-Cholecalciferol (CALCIUM 600 + D PO) Take 1 tablet by mouth daily.     Cholecalciferol (VITAMIN D) 2000 UNITS tablet Take 2,000 Units by mouth daily.     levothyroxine (SYNTHROID) 75 MCG tablet TAKE 1 TABLET EVERY DAY BEFORE BREAKFAST 90 tablet 0   Misc Natural Products (COSAMIN ASU ADVANCED FORMULA PO) Take 1 tablet by mouth daily.     Multiple Vitamin (MULTI VITAMIN DAILY PO) Take 1 tablet by mouth daily.     omeprazole (PRILOSEC) 40 MG capsule TAKE 1 CAPSULE EVERY DAY 90  capsule 0   phentermine 37.5 MG capsule Take 1 capsule (37.5 mg total) by mouth every morning. 30 capsule 0   Probiotic Product (PROBIOTIC DAILY) CAPS Take 1 capsule by mouth daily.     Specialty Vitamins Products (MAGNESIUM, AMINO ACID CHELATE,) 133 MG tablet Take 1 tablet by mouth daily.     valACYclovir (VALTREX) 1000 MG tablet TAKE 1 TABLET (1,000 MG TOTAL) BY MOUTH DAILY. (Patient taking differently: Take 1,000 mg by mouth daily as needed.) 90 tablet 0   venlafaxine XR (EFFEXOR-XR) 37.5 MG 24 hr capsule TAKE 1 CAPSULE EVERY DAY WITH BREAKFAST 90 capsule 0   No current facility-administered medications for this visit.    Allergies  Allergen Reactions   Sulfa Antibiotics Hives   Codeine Nausea And Vomiting   Penicillins Hives    Family History  Problem Relation Age of Onset   Lung cancer Father        smoker   Breast cancer Neg Hx     Social History   Socioeconomic History   Marital status: Married    Spouse name: Not on file   Number of children: Not on file   Years of education: Not on file   Highest education level: Not on file  Occupational History   Not on file  Tobacco Use   Smoking status: Never   Smokeless tobacco: Never  Substance and Sexual Activity   Alcohol use: Yes    Alcohol/week: 1.0 standard drink of alcohol    Types: 1 Glasses of wine per week   Drug use: No   Sexual activity: Yes    Partners: Male    Comment: Husband  Other Topics Concern   Not on file  Social History Narrative   Lives with husband (5 years of marriage)    Children-3    No Pets   Right handed    Caffeine- 2-3 cups coffee, tea occasionally    Enjoys being with the grandchildren    Social Determinants of Health   Financial Resource Strain: Not on file  Food Insecurity: Not on file  Transportation Needs: Not on file  Physical Activity: Not on file  Stress: Not on file  Social Connections: Not on file  Intimate Partner Violence: Not on file     Constitutional:  Denies fever, malaise, fatigue, headache or abrupt weight changes.  HEENT: Denies eye pain, eye redness, ear pain, ringing in the ears, wax buildup, runny nose, nasal congestion, bloody nose, or sore throat. Respiratory: Denies difficulty breathing, shortness of breath, cough or sputum production.   Cardiovascular: Denies chest pain, chest tightness, palpitations or swelling in the hands or feet.  Gastrointestinal: Patient reports intermittent constipation.  Denies abdominal pain, bloating,  diarrhea or blood in the stool.  GU: Denies urgency, frequency, pain with urination, burning sensation, blood in urine, odor or discharge. Musculoskeletal: Patient reports left-sided neck pain, left shoulder pain.  Denies decrease in range of motion, difficulty with gait, muscle pain or joint swelling.  Skin: Denies redness, rashes, lesions or ulcercations.  Neurological: Patient reports paresthesia of left upper extremity.  Denies dizziness, difficulty with memory, difficulty with speech or problems with balance and coordination.  Psych: Patient has a history of anxiety.  Denies depression, SI/HI.  No other specific complaints in a complete review of systems (except as listed in HPI above).  Objective:   Physical Exam  BP 134/84 (BP Location: Right Arm, Patient Position: Sitting, Cuff Size: Normal)   Pulse 80   Temp (!) 96.9 F (36.1 C) (Temporal)   Ht _0  (1.702 m)   Wt 193 lb (87.5 kg)   SpO2 96%   BMI 30.23 kg/m   Wt Readings from Last 3 Encounters:  03/13/21 200 lb 1.6 oz (90.8 kg)  08/21/20 190 lb (86.2 kg)  04/24/20 185 lb (83.9 kg)    General: Appears her stated age, obese in NAD. Skin: Warm, dry and intact.  HEENT: Head: normal shape and size; Eyes: sclera white, no icterus, conjunctiva pink, PERRLA and EOMs intact;  Neck:  Neck supple, trachea midline. No masses, lumps or thyromegaly present.  Cardiovascular: Normal rate and rhythm. S1,S2 noted.  No murmur, rubs or gallops noted.  No JVD or BLE edema. No carotid bruits noted. Pulmonary/Chest: Normal effort and positive vesicular breath sounds. No respiratory distress. No wheezes, rales or ronchi noted.  Abdomen: Normal bowel sounds.  Musculoskeletal: Strength 5/5 BUE/BLE.  No difficulty with gait.  Neurological: Alert and oriented. Cranial nerves II-XII grossly intact. Coordination normal.  Psychiatric: Mood and affect normal. Behavior is normal. Judgment and thought content normal.    BMET    Component Value Date/Time   NA 138 08/21/2020 1129   NA 140 12/05/2013 0000   K 4.6 08/21/2020 1129   CL 100 08/21/2020 1129   CO2 30 08/21/2020 1129   GLUCOSE 93 08/21/2020 1129   BUN 16 08/21/2020  1129   BUN 16 12/05/2013 0000   CREATININE 0.94 08/21/2020 1129   CALCIUM 9.8 08/21/2020 1129    Lipid Panel     Component Value Date/Time   CHOL 264 (H) 08/21/2020 1129   TRIG 87.0 08/21/2020 1129   HDL 94.60 08/21/2020 1129   CHOLHDL 3 08/21/2020 1129   VLDL 17.4 08/21/2020 1129   LDLCALC 152 (H) 08/21/2020 1129    CBC    Component Value Date/Time   WBC 4.7 08/21/2020 1129   RBC 4.93 08/21/2020 1129   HGB 12.4 08/21/2020 1129   HCT 39.0 08/21/2020 1129   PLT 272.0 08/21/2020 1129   MCV 79.1 08/21/2020 1129   MCHC 31.7 08/21/2020 1129   RDW 16.6 (H) 08/21/2020 1129   LYMPHSABS 1.7 12/20/2014 1700   MONOABS 0.4 12/20/2014 1700   EOSABS 0.2 12/20/2014 1700   BASOSABS 0.0 12/20/2014 1700    Hgb A1C Lab Results  Component Value Date   HGBA1C 5.6 03/13/2021           Assessment & Plan:   Preventative Health Maintenance:  Flu shot today Tetanus due, she will consider getting this at the pharmacy versus waiting until she gets bit or cut. Encouraged her to get her COVID booster Pneumovax and Prevnar UTD Shingrix UTD She no longer wants to screen for cervical cancer Mammogram UTD Bone density ordered-she will call to schedule Colon screening UTD Encouraged her to consume a balanced diet and  exercise regimen Advised her to see an eye doctor and dentist annually We will check CBC, c-Met, lipid, TSH, free T4, A1c  RTC in 6 months, follow-up chronic conditions Webb Silversmith, NP

## 2022-03-27 NOTE — Patient Instructions (Signed)
Health Maintenance After Age 71 After age 71, you are at a higher risk for certain long-term diseases and infections as well as injuries from falls. Falls are a major cause of broken bones and head injuries in people who are older than age 71. Getting regular preventive care can help to keep you healthy and well. Preventive care includes getting regular testing and making lifestyle changes as recommended by your health care provider. Talk with your health care provider about: Which screenings and tests you should have. A screening is a test that checks for a disease when you have no symptoms. A diet and exercise plan that is right for you. What should I know about screenings and tests to prevent falls? Screening and testing are the best ways to find a health problem early. Early diagnosis and treatment give you the best chance of managing medical conditions that are common after age 71. Certain conditions and lifestyle choices may make you more likely to have a fall. Your health care provider may recommend: Regular vision checks. Poor vision and conditions such as cataracts can make you more likely to have a fall. If you wear glasses, make sure to get your prescription updated if your vision changes. Medicine review. Work with your health care provider to regularly review all of the medicines you are taking, including over-the-counter medicines. Ask your health care provider about any side effects that may make you more likely to have a fall. Tell your health care provider if any medicines that you take make you feel dizzy or sleepy. Strength and balance checks. Your health care provider may recommend certain tests to check your strength and balance while standing, walking, or changing positions. Foot health exam. Foot pain and numbness, as well as not wearing proper footwear, can make you more likely to have a fall. Screenings, including: Osteoporosis screening. Osteoporosis is a condition that causes  the bones to get weaker and break more easily. Blood pressure screening. Blood pressure changes and medicines to control blood pressure can make you feel dizzy. Depression screening. You may be more likely to have a fall if you have a fear of falling, feel depressed, or feel unable to do activities that you used to do. Alcohol use screening. Using too much alcohol can affect your balance and may make you more likely to have a fall. Follow these instructions at home: Lifestyle Do not drink alcohol if: Your health care provider tells you not to drink. If you drink alcohol: Limit how much you have to: 0-1 drink a day for women. 0-2 drinks a day for men. Know how much alcohol is in your drink. In the [place] one drink equals one 12 oz bottle of beer (355 mL), one 5 oz glass of wine (148 mL), or one 1 oz glass of hard liquor (44 mL). Do not use any products that contain nicotine or tobacco. These products include cigarettes, chewing tobacco, and vaping devices, such as e-cigarettes. If you need help quitting, ask your health care provider. Activity  Follow a regular exercise program to stay fit. This will help you maintain your balance. Ask your health care provider what types of exercise are appropriate for you. If you need a cane or walker, use it as recommended by your health care provider. Wear supportive shoes that have nonskid soles. Safety  Remove any tripping hazards, such as rugs, cords, and clutter. Install safety equipment such as grab bars in bathrooms and safety rails on stairs. Keep rooms and walkways   well-lit. General instructions Talk with your health care provider about your risks for falling. Tell your health care provider if: You fall. Be sure to tell your health care provider about all falls, even ones that seem minor. You feel dizzy, tiredness (fatigue), or off-balance. Take over-the-counter and prescription medicines only as told by your health care provider. These include  supplements. Eat a healthy diet and maintain a healthy weight. A healthy diet includes low-fat dairy products, low-fat (lean) meats, and fiber from whole grains, beans, and lots of fruits and vegetables. Stay current with your vaccines. Schedule regular health, dental, and eye exams. Summary Having a healthy lifestyle and getting preventive care can help to protect your health and wellness after age 71. Screening and testing are the best way to find a health problem early and help you avoid having a fall. Early diagnosis and treatment give you the best chance for managing medical conditions that are more common for people who are older than age 71. Falls are a major cause of broken bones and head injuries in people who are older than age 71. Take precautions to prevent a fall at home. Work with your health care provider to learn what changes you can make to improve your health and wellness and to prevent falls. This information is not intended to replace advice given to you by your health care provider. Make sure you discuss any questions you have with your health care provider. Document Revised: 09/17/2020 Document Reviewed: 09/17/2020 Elsevier Patient Education  2023 Elsevier Inc.  

## 2022-03-28 LAB — HEMOGLOBIN A1C
Hgb A1c MFr Bld: 6 % of total Hgb — ABNORMAL HIGH (ref <5.7)
Mean Plasma Glucose: 126 mg/dL
eAG (mmol/L): 7 mmol/L

## 2022-03-28 LAB — CBC
HCT: 40.4 % (ref 35.0–45.0)
Hemoglobin: 13.4 g/dL (ref 11.7–15.5)
MCH: 29.2 pg (ref 27.0–33.0)
MCHC: 33.2 g/dL (ref 32.0–36.0)
MCV: 88 fL (ref 80.0–100.0)
MPV: 11.2 fL (ref 7.5–12.5)
Platelets: 315 10*3/uL (ref 140–400)
RBC: 4.59 10*6/uL (ref 3.80–5.10)
RDW: 13.1 % (ref 11.0–15.0)
WBC: 10.7 10*3/uL (ref 3.8–10.8)

## 2022-03-28 LAB — LIPID PANEL
Cholesterol: 271 mg/dL — ABNORMAL HIGH (ref <200)
HDL: 86 mg/dL (ref >=50)
LDL Cholesterol (Calc): 170 mg/dL (calc) — ABNORMAL HIGH
Non-HDL Cholesterol (Calc): 185 mg/dL (calc) — ABNORMAL HIGH (ref <130)
Total CHOL/HDL Ratio: 3.2 (calc) (ref <5.0)
Triglycerides: 57 mg/dL (ref <150)

## 2022-03-28 LAB — COMPLETE METABOLIC PANEL WITH GFR
AG Ratio: 1.7 (calc) (ref 1.0–2.5)
ALT: 16 U/L (ref 6–29)
AST: 17 U/L (ref 10–35)
Albumin: 4.3 g/dL (ref 3.6–5.1)
Alkaline phosphatase (APISO): 50 U/L (ref 37–153)
BUN: 23 mg/dL (ref 7–25)
CO2: 24 mmol/L (ref 20–32)
Calcium: 10 mg/dL (ref 8.6–10.4)
Chloride: 102 mmol/L (ref 98–110)
Creat: 0.83 mg/dL (ref 0.60–1.00)
Globulin: 2.5 g/dL (calc) (ref 1.9–3.7)
Glucose, Bld: 115 mg/dL — ABNORMAL HIGH (ref 65–99)
Potassium: 4.6 mmol/L (ref 3.5–5.3)
Sodium: 137 mmol/L (ref 135–146)
Total Bilirubin: 0.5 mg/dL (ref 0.2–1.2)
Total Protein: 6.8 g/dL (ref 6.1–8.1)
eGFR: 75 mL/min/{1.73_m2} (ref >=60)

## 2022-03-28 LAB — TSH: TSH: 0.37 mIU/L — ABNORMAL LOW (ref 0.40–4.50)

## 2022-03-28 LAB — T4, FREE: Free T4: 1.2 ng/dL (ref 0.8–1.8)

## 2022-03-31 ENCOUNTER — Encounter: Payer: Self-pay | Admitting: Internal Medicine

## 2022-04-10 DIAGNOSIS — L814 Other melanin hyperpigmentation: Secondary | ICD-10-CM | POA: Diagnosis not present

## 2022-04-10 DIAGNOSIS — L821 Other seborrheic keratosis: Secondary | ICD-10-CM | POA: Diagnosis not present

## 2022-04-10 DIAGNOSIS — Z85828 Personal history of other malignant neoplasm of skin: Secondary | ICD-10-CM | POA: Diagnosis not present

## 2022-04-10 DIAGNOSIS — L578 Other skin changes due to chronic exposure to nonionizing radiation: Secondary | ICD-10-CM | POA: Diagnosis not present

## 2022-04-10 DIAGNOSIS — D229 Melanocytic nevi, unspecified: Secondary | ICD-10-CM | POA: Diagnosis not present

## 2022-04-15 DIAGNOSIS — M5412 Radiculopathy, cervical region: Secondary | ICD-10-CM | POA: Diagnosis not present

## 2022-04-17 DIAGNOSIS — M542 Cervicalgia: Secondary | ICD-10-CM | POA: Diagnosis not present

## 2022-04-17 DIAGNOSIS — M25512 Pain in left shoulder: Secondary | ICD-10-CM | POA: Diagnosis not present

## 2022-04-17 DIAGNOSIS — M5412 Radiculopathy, cervical region: Secondary | ICD-10-CM | POA: Diagnosis not present

## 2022-04-17 DIAGNOSIS — M7542 Impingement syndrome of left shoulder: Secondary | ICD-10-CM | POA: Diagnosis not present

## 2022-04-17 DIAGNOSIS — M47812 Spondylosis without myelopathy or radiculopathy, cervical region: Secondary | ICD-10-CM | POA: Diagnosis not present

## 2022-04-18 ENCOUNTER — Telehealth: Payer: Self-pay | Admitting: Internal Medicine

## 2022-04-18 NOTE — Telephone Encounter (Signed)
N/A unable to leave a message for patient to call back and schedule the Medicare Annual Wellness Visit (AWV) virtually or by telephone.  Last AWV 08/21/20  Please schedule at anytime with [place].   Any questions, please call me at [phone]

## 2022-04-21 DIAGNOSIS — M5412 Radiculopathy, cervical region: Secondary | ICD-10-CM | POA: Diagnosis not present

## 2022-04-24 DIAGNOSIS — M5412 Radiculopathy, cervical region: Secondary | ICD-10-CM | POA: Diagnosis not present

## 2022-04-28 DIAGNOSIS — M5412 Radiculopathy, cervical region: Secondary | ICD-10-CM | POA: Diagnosis not present

## 2022-05-07 DIAGNOSIS — M5412 Radiculopathy, cervical region: Secondary | ICD-10-CM | POA: Diagnosis not present

## 2022-05-09 DIAGNOSIS — M5412 Radiculopathy, cervical region: Secondary | ICD-10-CM | POA: Diagnosis not present

## 2022-05-09 DIAGNOSIS — M25512 Pain in left shoulder: Secondary | ICD-10-CM | POA: Diagnosis not present

## 2022-05-14 DIAGNOSIS — M5412 Radiculopathy, cervical region: Secondary | ICD-10-CM | POA: Diagnosis not present

## 2022-05-20 ENCOUNTER — Telehealth: Payer: Self-pay

## 2022-05-20 NOTE — Telephone Encounter (Signed)
I left a message for the patient to call back and schedule their Medicare Annual Wellness Visit (AWV) virtually, by telephone, or face-to-face.   Lissette Schenk, CMA [phone]  

## 2022-05-21 DIAGNOSIS — M5412 Radiculopathy, cervical region: Secondary | ICD-10-CM | POA: Diagnosis not present

## 2022-06-03 DIAGNOSIS — Z8669 Personal history of other diseases of the nervous system and sense organs: Secondary | ICD-10-CM | POA: Diagnosis not present

## 2022-06-03 DIAGNOSIS — Z0100 Encounter for examination of eyes and vision without abnormal findings: Secondary | ICD-10-CM | POA: Diagnosis not present

## 2022-06-03 DIAGNOSIS — Z961 Presence of intraocular lens: Secondary | ICD-10-CM | POA: Diagnosis not present

## 2022-06-03 DIAGNOSIS — Z9889 Other specified postprocedural states: Secondary | ICD-10-CM | POA: Diagnosis not present

## 2022-06-09 ENCOUNTER — Encounter: Payer: Self-pay | Admitting: Internal Medicine

## 2022-06-09 DIAGNOSIS — R7303 Prediabetes: Secondary | ICD-10-CM

## 2022-06-09 DIAGNOSIS — E7800 Pure hypercholesterolemia, unspecified: Secondary | ICD-10-CM

## 2022-06-09 DIAGNOSIS — E6609 Other obesity due to excess calories: Secondary | ICD-10-CM

## 2022-06-09 MED ORDER — OZEMPIC (0.25 OR 0.5 MG/DOSE) 2 MG/3ML ~~LOC~~ SOPN: 0.5000 mg | PEN_INJECTOR | SUBCUTANEOUS | 0 refills | Status: DC

## 2022-06-23 ENCOUNTER — Other Ambulatory Visit: Payer: Self-pay | Admitting: Internal Medicine

## 2022-06-23 DIAGNOSIS — K219 Gastro-esophageal reflux disease without esophagitis: Secondary | ICD-10-CM

## 2022-06-23 NOTE — Telephone Encounter (Signed)
Requested Prescriptions  Pending Prescriptions Disp Refills   venlafaxine XR (EFFEXOR-XR) 37.5 MG 24 hr capsule [Pharmacy Med Name: VENLAFAXINE HYDROCHLORIDEER 37.5 MG Capsule Extended Release 24 Hour] 90 capsule 1    Sig: TAKE 1 CAPSULE EVERY DAY WITH BREAKFAST     Psychiatry: Antidepressants - SNRI - desvenlafaxine & venlafaxine Failed - 06/23/2022  2:53 AM      Failed - Lipid Panel in normal range within the last 12 months    Cholesterol  Date Value Ref Range Status  03/27/2022 271 (H) <200 mg/dL Final   LDL Cholesterol (Calc)  Date Value Ref Range Status  03/27/2022 170 (H) mg/dL (calc) Final    Comment:    Reference range: <100 . Desirable range <100 mg/dL for primary prevention;   <70 mg/dL for patients with CHD or diabetic patients  with > or = 2 CHD risk factors. Marland Kitchen LDL-C is now calculated using the Martin-Hopkins  calculation, which is a validated novel method providing  better accuracy than the Friedewald equation in the  estimation of LDL-C.  Cresenciano Genre et al. Annamaria Helling. MU:[id number]): 2061-2068  (http://education.[facility].com/faq/FAQ164)    HDL  Date Value Ref Range Status  03/27/2022 86 > OR = 50 mg/dL Final   Triglycerides  Date Value Ref Range Status  03/27/2022 57 <150 mg/dL Final         Passed - Cr in normal range and within 360 days    Creat  Date Value Ref Range Status  03/27/2022 0.83 0.60 - 1.00 mg/dL Final         Passed - Last BP in normal range    BP Readings from Last 1 Encounters:  03/27/22 134/84         Passed - Valid encounter within last 6 months    Recent Outpatient Visits           2 months ago Encounter for general adult medical examination with abnormal findings   [facility] [place], Coralie Keens, NP   1 year ago Pelvic pressure in female   [place], NP       Future Appointments             In 3 months Baity, Coralie Keens, NP [facility]             omeprazole (PRILOSEC) 40 MG capsule [place] Med Name: OMEPRAZOLE 40 MG Capsule Delayed Release] 90 capsule 2    Sig: TAKE [place]     Gastroenterology: Proton Pump Inhibitors Passed - 06/23/2022  2:53 AM      Passed - Valid encounter within last 12 months    Recent Outpatient Visits           2 months ago Encounter for general adult medical examination with abnormal findings   [facility] [place], Coralie Keens, NP   1 year ago Pelvic pressure in female   [facility] [place], Coralie Keens, NP       Future Appointments             In 3 months Baity, Coralie Keens, NP [facility], [facility]

## 2022-08-28 ENCOUNTER — Other Ambulatory Visit: Payer: Self-pay | Admitting: Internal Medicine

## 2022-08-28 DIAGNOSIS — Z1231 Encounter for screening mammogram for malignant neoplasm of breast: Secondary | ICD-10-CM

## 2022-09-04 DIAGNOSIS — M531 Cervicobrachial syndrome: Secondary | ICD-10-CM | POA: Diagnosis not present

## 2022-09-04 DIAGNOSIS — M4004 Postural kyphosis, thoracic region: Secondary | ICD-10-CM | POA: Diagnosis not present

## 2022-09-04 DIAGNOSIS — M9903 Segmental and somatic dysfunction of lumbar region: Secondary | ICD-10-CM | POA: Diagnosis not present

## 2022-09-04 DIAGNOSIS — M9901 Segmental and somatic dysfunction of cervical region: Secondary | ICD-10-CM | POA: Diagnosis not present

## 2022-09-04 DIAGNOSIS — M9902 Segmental and somatic dysfunction of thoracic region: Secondary | ICD-10-CM | POA: Diagnosis not present

## 2022-09-04 DIAGNOSIS — M9904 Segmental and somatic dysfunction of sacral region: Secondary | ICD-10-CM | POA: Diagnosis not present

## 2022-09-08 DIAGNOSIS — M531 Cervicobrachial syndrome: Secondary | ICD-10-CM | POA: Diagnosis not present

## 2022-09-08 DIAGNOSIS — M9902 Segmental and somatic dysfunction of thoracic region: Secondary | ICD-10-CM | POA: Diagnosis not present

## 2022-09-08 DIAGNOSIS — M5451 Vertebrogenic low back pain: Secondary | ICD-10-CM | POA: Diagnosis not present

## 2022-09-08 DIAGNOSIS — M9903 Segmental and somatic dysfunction of lumbar region: Secondary | ICD-10-CM | POA: Diagnosis not present

## 2022-09-08 DIAGNOSIS — M9901 Segmental and somatic dysfunction of cervical region: Secondary | ICD-10-CM | POA: Diagnosis not present

## 2022-09-08 DIAGNOSIS — M4004 Postural kyphosis, thoracic region: Secondary | ICD-10-CM | POA: Diagnosis not present

## 2022-09-08 DIAGNOSIS — M9904 Segmental and somatic dysfunction of sacral region: Secondary | ICD-10-CM | POA: Diagnosis not present

## 2022-09-19 ENCOUNTER — Ambulatory Visit
Admission: RE | Admit: 2022-09-19 | Discharge: 2022-09-19 | Disposition: A | Discharge status: Home / Self Care | Payer: Medicare HMO | Source: Ambulatory Visit | Attending: Internal Medicine | Admitting: Internal Medicine

## 2022-09-19 DIAGNOSIS — Z1231 Encounter for screening mammogram for malignant neoplasm of breast: Secondary | ICD-10-CM | POA: Diagnosis not present

## 2022-09-25 ENCOUNTER — Ambulatory Visit (INDEPENDENT_AMBULATORY_CARE_PROVIDER_SITE_OTHER): Payer: Medicare HMO | Admitting: Internal Medicine

## 2022-09-25 ENCOUNTER — Encounter: Payer: Self-pay | Admitting: Internal Medicine

## 2022-09-25 VITALS — BP 114/72 | HR 85 | Temp 96.8°F | Wt 194.0 lb

## 2022-09-25 DIAGNOSIS — Z6830 Body mass index (BMI) 30.0-30.9, adult: Secondary | ICD-10-CM

## 2022-09-25 DIAGNOSIS — M62830 Muscle spasm of back: Secondary | ICD-10-CM | POA: Diagnosis not present

## 2022-09-25 DIAGNOSIS — Z8619 Personal history of other infectious and parasitic diseases: Secondary | ICD-10-CM

## 2022-09-25 DIAGNOSIS — E6609 Other obesity due to excess calories: Secondary | ICD-10-CM | POA: Diagnosis not present

## 2022-09-25 DIAGNOSIS — M8589 Other specified disorders of bone density and structure, multiple sites: Secondary | ICD-10-CM

## 2022-09-25 DIAGNOSIS — R7303 Prediabetes: Secondary | ICD-10-CM

## 2022-09-25 DIAGNOSIS — F411 Generalized anxiety disorder: Secondary | ICD-10-CM

## 2022-09-25 DIAGNOSIS — K219 Gastro-esophageal reflux disease without esophagitis: Secondary | ICD-10-CM | POA: Diagnosis not present

## 2022-09-25 DIAGNOSIS — E7800 Pure hypercholesterolemia, unspecified: Secondary | ICD-10-CM | POA: Diagnosis not present

## 2022-09-25 DIAGNOSIS — E039 Hypothyroidism, unspecified: Secondary | ICD-10-CM

## 2022-09-25 MED ORDER — PHENTERMINE HCL 37.5 MG PO CAPS: 37.5000 mg | ORAL_CAPSULE | ORAL | 0 refills | Status: DC

## 2022-09-25 MED ORDER — SERTRALINE HCL 50 MG PO TABS: 50.0000 mg | ORAL_TABLET | Freq: Every day | ORAL | 1 refills | Status: DC

## 2022-09-25 MED ORDER — CYCLOBENZAPRINE HCL 10 MG PO TABS: 10.0000 mg | ORAL_TABLET | Freq: Three times a day (TID) | ORAL | 0 refills | Status: AC

## 2022-09-25 NOTE — Assessment & Plan Note (Signed)
Deteriorated secondary to situational stress Will transition to sertraline from venlafaxine Support offered

## 2022-09-25 NOTE — Assessment & Plan Note (Signed)
TSH and free T4 today We will adjust levothyroxine if needed based on labs 

## 2022-09-25 NOTE — Assessment & Plan Note (Signed)
Continue calcium and vitamin D Encourage daily weightbearing exercise 

## 2022-09-25 NOTE — Assessment & Plan Note (Signed)
Encouraged diet and exercise for weight loss ?

## 2022-09-25 NOTE — Assessment & Plan Note (Signed)
Continue cyclobenzaprine as needed Encouraged to do stretching

## 2022-09-25 NOTE — Assessment & Plan Note (Signed)
C-Met and lipid profile today Encouraged her to consume low-fat diet 

## 2022-09-25 NOTE — Progress Notes (Signed)
Subjective:    Patient ID: Sarah Jacobson, female    DOB: [birth year], 72 y.o.   MRN: [id number]  HPI  Patient presents to clinic today for 57-month follow-up of chronic conditions.  Anxiety: Persistent, managed on Venlafaxine. She has been under a lot of stress lately. She does note a lack of emotion on this medication. She is not currently seeing a therapist.  She denies depression, SI/HI.  GERD: Triggered by history of stricture.  She occasionally has difficulty swallowing. She denies frequent breakthrough on Omeprazole.  Upper GI from 09/2019 reviewed.  HLD: Her last LDL was 170, triglycerides 57, 03/2022.  She is not taking any cholesterol-lowering medication.  She does not consume low-fat diet.  Hypothyroidism: She denies any issues on her current dose of Levothyroxine.  She does not follow with endocrinology.  Muscle Spasms: Mainly in her back.  She takes Cyclobenzaprine as needed.  She would like a refill of this today.  Osteopenia: She is taking Calcium and Vitamin D OTC.  She tries to get some weightbearing exercise in.  Bone density from 06/2018 reviewed.  Prediabetes: Her last A1c was 6%, 03/2022.  She is not taking Ozempic as prescribed.  She does not check her sugars.  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  Review of Systems     Past Medical History:  Diagnosis Date   Anxiety    Complication of anesthesia    COVID-19    External hemorrhoids    GERD (gastroesophageal reflux disease)    Hyperlipidemia    Hypothyroidism    Internal hemorrhoids    PONV (postoperative nausea and vomiting)    Thyroid disease     Current Outpatient Medications  Medication Sig Dispense Refill   acyclovir ointment (ZOVIRAX) 5 % Apply 1 application topically in the morning, at noon, and at bedtime. 15 g 0   b complex vitamins tablet Take 1 tablet by mouth daily.     Calcium Carb-Cholecalciferol (CALCIUM 600 + D PO) Take 1 tablet by mouth daily.     Cholecalciferol  (VITAMIN D) 2000 UNITS tablet Take 2,000 Units by mouth daily.     cyclobenzaprine (FLEXERIL) 10 MG tablet Take 10 mg by mouth 3 (three) times daily.     levothyroxine (SYNTHROID) 75 MCG tablet TAKE 1 TABLET EVERY DAY BEFORE BREAKFAST 90 tablet 0   methylPREDNISolone (MEDROL DOSEPAK) 4 MG TBPK tablet Take by mouth.     Misc Natural Products (COSAMIN ASU ADVANCED FORMULA PO) Take 1 tablet by mouth daily.     omeprazole (PRILOSEC) 40 MG capsule TAKE 1 CAPSULE EVERY DAY 90 capsule 2   OZEMPIC, 0.25 OR 0.5 MG/DOSE, 2 MG/3ML SOPN Inject 0.5 mg into the skin once a week. Start with 0.25mg  weekly x 4 weeks then increase to 0.5mg  weekly injection. 9 mL 0   Probiotic Product (PROBIOTIC DAILY) CAPS Take 1 capsule by mouth daily.     Specialty Vitamins Products (MAGNESIUM, AMINO ACID CHELATE,) 133 MG tablet Take 1 tablet by mouth daily.     valACYclovir (VALTREX) 1000 MG tablet TAKE 1 TABLET (1,000 MG TOTAL) BY MOUTH DAILY. (Patient taking differently: Take 1,000 mg by mouth daily as needed.) 90 tablet 0   venlafaxine XR (EFFEXOR-XR) 37.5 MG 24 hr capsule TAKE 1 CAPSULE EVERY DAY WITH BREAKFAST 90 capsule 1   No current facility-administered medications for this visit.    Allergies  Allergen Reactions   Sulfa Antibiotics Hives   Codeine Nausea And Vomiting  Penicillins Hives    Family History  Problem Relation Age of Onset   Lung cancer Father        smoker   Breast cancer Neg Hx     Social History   Socioeconomic History   Marital status: Married    Spouse name: Not on file   Number of children: Not on file   Years of education: Not on file   Highest education level: 12th grade  Occupational History   Not on file  Tobacco Use   Smoking status: Never   Smokeless tobacco: Never  Substance and Sexual Activity   Alcohol use: Yes    Alcohol/week: 1.0 standard drink of alcohol    Types: 1 Glasses of wine per week   Drug use: No   Sexual activity: Yes    Partners: Male    Comment:  Husband  Other Topics Concern   Not on file  Social History Narrative   Lives with husband (37 years of marriage)    Children-3    No Pets   Right handed    Caffeine- 2-3 cups coffee, tea occasionally    Enjoys being with the grandchildren    Social Determinants of Health   Financial Resource Strain: Low Risk  (09/24/2022)   Overall Financial Resource Strain (CARDIA)    Difficulty of Paying Living Expenses: Not hard at all  Food Insecurity: No Food Insecurity (09/24/2022)   Hunger Vital Sign    Worried About Running Out of Food in the Last Year: Never true    Ran Out of Food in the Last Year: Never true  Transportation Needs: No Transportation Needs (09/24/2022)   PRAPARE - Administrator, Civil Service (Medical): No    Lack of Transportation (Non-Medical): No  Physical Activity: Unknown (09/24/2022)   Exercise Vital Sign    Days of Exercise per Week: 0 days    Minutes of Exercise per Session: Not on file  Stress: Stress Concern Present (09/24/2022)   [employer] of Occupational Health - Occupational Stress Questionnaire    Feeling of Stress : Rather much  Social Connections: Socially Integrated (09/24/2022)   Social Connection and Isolation Panel [NHANES]    Frequency of Communication with Friends and Family: More than three times a week    Frequency of Social Gatherings with Friends and Family: More than three times a week    Attends Religious Services: More than 4 times per year    Active Member of [employer] or Organizations: Yes    Attends Engineer, structural: More than 4 times per year    Marital Status: Married  Catering manager Violence: Not on file     Constitutional: Denies fever, malaise, fatigue, headache or abrupt weight changes.  HEENT: Denies eye pain, eye redness, ear pain, ringing in the ears, wax buildup, runny nose, nasal congestion, bloody nose, or sore throat. Respiratory: Denies difficulty breathing, shortness of breath, cough or  sputum production.   Cardiovascular: Denies chest pain, chest tightness, palpitations or swelling in the hands or feet.  Gastrointestinal: Pt reports intermittent difficulty swallowing. Denies abdominal pain, bloating, constipation, diarrhea or blood in the stool.  GU: Denies urgency, frequency, pain with urination, burning sensation, blood in urine, odor or discharge. Musculoskeletal: Pt reports intermittent back pain. Denies decrease in range of motion, difficulty with gait, or joint pain and swelling.  Skin: Pt reports intermittent redness under breast. Denies lesions or ulcercations.  Neurological: Denies dizziness, difficulty with memory, difficulty with speech or  problems with balance and coordination.  Psych: Patient has a history of anxiety.  Denies depression, SI/HI.  No other specific complaints in a complete review of systems (except as listed in HPI above).  Objective:   Physical Exam  BP 114/72 (BP Location: Right Arm, Patient Position: Sitting, Cuff Size: Normal)   Pulse 85   Temp (!) 96.8 F (36 C) (Temporal)   Wt 194 lb (88 kg)   SpO2 98%   BMI 30.38 kg/m   Wt Readings from Last 3 Encounters:  03/27/22 193 lb (87.5 kg)  03/13/21 200 lb 1.6 oz (90.8 kg)  08/21/20 190 lb (86.2 kg)    General: Appears her stated age, obese, in NAD. Skin: Warm, dry and intact.  HEENT: Head: normal shape and size; Eyes: sclera white, no icterus, conjunctiva pink, PERRLA and EOMs intact;  Neck:  Neck supple, trachea midline. No masses, lumps or thyromegaly present.  Cardiovascular: Normal rate and rhythm. S1,S2 noted.  No murmur, rubs or gallops noted. No JVD or BLE edema. No carotid bruits noted. Pulmonary/Chest: Normal effort and positive vesicular breath sounds. No respiratory distress. No wheezes, rales or ronchi noted.  Abdomen: Normal bowel sounds.  Musculoskeletal:  No difficulty with gait.  Neurological: Alert and oriented. Coordination normal.  Psychiatric: Mood and affect  normal. Mildly anxious appearing. Judgment and thought content normal.     BMET    Component Value Date/Time   NA 137 03/27/2022 1024   NA 140 12/05/2013 0000   K 4.6 03/27/2022 1024   CL 102 03/27/2022 1024   CO2 24 03/27/2022 1024   GLUCOSE 115 (H) 03/27/2022 1024   BUN 23 03/27/2022 1024   BUN 16 12/05/2013 0000   CREATININE 0.83 03/27/2022 1024   CALCIUM 10.0 03/27/2022 1024    Lipid Panel     Component Value Date/Time   CHOL 271 (H) 03/27/2022 1024   TRIG 57 03/27/2022 1024   HDL 86 03/27/2022 1024   CHOLHDL 3.2 03/27/2022 1024   VLDL 17.4 08/21/2020 1129   LDLCALC 170 (H) 03/27/2022 1024    CBC    Component Value Date/Time   WBC 10.7 03/27/2022 1024   RBC 4.59 03/27/2022 1024   HGB 13.4 03/27/2022 1024   HCT 40.4 03/27/2022 1024   PLT 315 03/27/2022 1024   MCV 88.0 03/27/2022 1024   MCH 29.2 03/27/2022 1024   MCHC 33.2 03/27/2022 1024   RDW 13.1 03/27/2022 1024   LYMPHSABS 1.7 12/20/2014 1700   MONOABS 0.4 12/20/2014 1700   EOSABS 0.2 12/20/2014 1700   BASOSABS 0.0 12/20/2014 1700    Hgb A1C Lab Results  Component Value Date   HGBA1C 6.0 (H) 03/27/2022            Assessment & Plan:      RTC in 6 months for your annual exam Nicki Reaper, NP

## 2022-09-25 NOTE — Assessment & Plan Note (Signed)
Continue omeprazole She does not feel like she needs referral to GI at this time

## 2022-09-25 NOTE — Patient Instructions (Signed)

## 2022-09-25 NOTE — Assessment & Plan Note (Signed)
A1c today Encourage low-carb diet and exercise for weight loss 

## 2022-09-25 NOTE — Addendum Note (Signed)
Addended by: Lorre Munroe on: 09/25/2022 10:58 AM   Modules accepted: Orders

## 2022-09-25 NOTE — Assessment & Plan Note (Signed)
Continue valacyclovir as needed 

## 2022-09-26 ENCOUNTER — Encounter: Payer: Self-pay | Admitting: Internal Medicine

## 2022-09-26 LAB — COMPLETE METABOLIC PANEL WITH GFR
AG Ratio: 1.6 (calc) (ref 1.0–2.5)
ALT: 19 U/L (ref 6–29)
AST: 18 U/L (ref 10–35)
Albumin: 4.2 g/dL (ref 3.6–5.1)
Alkaline phosphatase (APISO): 63 U/L (ref 37–153)
BUN: 15 mg/dL (ref 7–25)
CO2: 26 mmol/L (ref 20–32)
Calcium: 9.4 mg/dL (ref 8.6–10.4)
Chloride: 103 mmol/L (ref 98–110)
Creat: 0.81 mg/dL (ref 0.60–1.00)
Globulin: 2.6 g/dL (calc) (ref 1.9–3.7)
Glucose, Bld: 103 mg/dL — ABNORMAL HIGH (ref 65–99)
Potassium: 4.4 mmol/L (ref 3.5–5.3)
Sodium: 139 mmol/L (ref 135–146)
Total Bilirubin: 0.6 mg/dL (ref 0.2–1.2)
Total Protein: 6.8 g/dL (ref 6.1–8.1)
eGFR: 78 mL/min/{1.73_m2} (ref >=60)

## 2022-09-26 LAB — HEMOGLOBIN A1C
Hgb A1c MFr Bld: 6.1 % of total Hgb — ABNORMAL HIGH (ref <5.7)
Mean Plasma Glucose: 128 mg/dL
eAG (mmol/L): 7.1 mmol/L

## 2022-09-26 LAB — CBC
HCT: 42.1 % (ref 35.0–45.0)
Hemoglobin: 13.7 g/dL (ref 11.7–15.5)
MCH: 28.8 pg (ref 27.0–33.0)
MCHC: 32.5 g/dL (ref 32.0–36.0)
MCV: 88.4 fL (ref 80.0–100.0)
MPV: 11.1 fL (ref 7.5–12.5)
Platelets: 241 10*3/uL (ref 140–400)
RBC: 4.76 10*6/uL (ref 3.80–5.10)
RDW: 13.8 % (ref 11.0–15.0)
WBC: 5.6 10*3/uL (ref 3.8–10.8)

## 2022-09-26 LAB — LIPID PANEL
Cholesterol: 254 mg/dL — ABNORMAL HIGH (ref <200)
HDL: 97 mg/dL (ref >=50)
LDL Cholesterol (Calc): 138 mg/dL (calc) — ABNORMAL HIGH
Non-HDL Cholesterol (Calc): 157 mg/dL (calc) — ABNORMAL HIGH (ref <130)
Total CHOL/HDL Ratio: 2.6 (calc) (ref <5.0)
Triglycerides: 87 mg/dL (ref <150)

## 2022-09-26 LAB — T4, FREE: Free T4: 1.1 ng/dL (ref 0.8–1.8)

## 2022-09-26 LAB — TSH: TSH: 4.12 mIU/L (ref 0.40–4.50)

## 2022-11-04 ENCOUNTER — Other Ambulatory Visit: Payer: Self-pay

## 2022-11-04 MED ORDER — SERTRALINE HCL 50 MG PO TABS: 50.0000 mg | ORAL_TABLET | Freq: Every day | ORAL | 1 refills | Status: AC

## 2022-11-05 DIAGNOSIS — M4004 Postural kyphosis, thoracic region: Secondary | ICD-10-CM | POA: Diagnosis not present

## 2022-11-05 DIAGNOSIS — M9903 Segmental and somatic dysfunction of lumbar region: Secondary | ICD-10-CM | POA: Diagnosis not present

## 2022-11-05 DIAGNOSIS — M531 Cervicobrachial syndrome: Secondary | ICD-10-CM | POA: Diagnosis not present

## 2022-11-05 DIAGNOSIS — M9902 Segmental and somatic dysfunction of thoracic region: Secondary | ICD-10-CM | POA: Diagnosis not present

## 2022-11-05 DIAGNOSIS — M5451 Vertebrogenic low back pain: Secondary | ICD-10-CM | POA: Diagnosis not present

## 2022-11-05 DIAGNOSIS — M9904 Segmental and somatic dysfunction of sacral region: Secondary | ICD-10-CM | POA: Diagnosis not present

## 2022-11-05 DIAGNOSIS — M9901 Segmental and somatic dysfunction of cervical region: Secondary | ICD-10-CM | POA: Diagnosis not present

## 2022-11-05 MED ORDER — PHENTERMINE HCL 37.5 MG PO CAPS: 37.5000 mg | ORAL_CAPSULE | ORAL | 0 refills | Status: AC

## 2022-11-14 ENCOUNTER — Other Ambulatory Visit: Payer: Self-pay

## 2022-11-17 ENCOUNTER — Other Ambulatory Visit: Payer: Self-pay | Admitting: Internal Medicine

## 2022-11-17 NOTE — Telephone Encounter (Signed)
Requested medications are due for refill today.  unsure  Requested medications are on the active medications list.  no  Last refill. 06/23/2022 #90  Future visit scheduled.   yes  Notes to clinic.  Please review for refill.    Requested Prescriptions  Pending Prescriptions Disp Refills   venlafaxine XR (EFFEXOR-XR) 37.5 MG 24 hr capsule [Pharmacy Med Name: VENLAFAXINE HYDROCHLORIDEER 37.5 MG Capsule Extended Release 24 Hour] 90 capsule 3    Sig: TAKE 1 CAPSULE EVERY DAY WITH BREAKFAST     Psychiatry: Antidepressants - SNRI - desvenlafaxine & venlafaxine Failed - 11/17/2022  3:12 AM      Failed - Lipid Panel in normal range within the last 12 months    Cholesterol  Date Value Ref Range Status  09/25/2022 254 (H) <200 mg/dL Final   LDL Cholesterol (Calc)  Date Value Ref Range Status  09/25/2022 138 (H) mg/dL (calc) Final    Comment:    Reference range: <100 . Desirable range <100 mg/dL for primary prevention;   <70 mg/dL for patients with CHD or diabetic patients  with > or = 2 CHD risk factors. Marland Kitchen LDL-C is now calculated using the Martin-Hopkins  calculation, which is a validated novel method providing  better accuracy than the Friedewald equation in the  estimation of LDL-C.  Horald Pollen et al. Lenox Ahr. 1610;960(45): 2061-2068  (http://education.[facility].com/faq/FAQ164)    HDL  Date Value Ref Range Status  09/25/2022 97 > OR = 50 mg/dL Final   Triglycerides  Date Value Ref Range Status  09/25/2022 87 <150 mg/dL Final         Passed - Cr in normal range and within 360 days    Creat  Date Value Ref Range Status  09/25/2022 0.81 0.60 - 1.00 mg/dL Final         Passed - Last BP in normal range    BP Readings from Last 1 Encounters:  09/25/22 114/72         Passed - Valid encounter within last 6 months    Recent Outpatient Visits           1 month ago Prediabetes   [facility] [place], Salvadore Oxford, NP   7 months ago Encounter for  general adult medical examination with abnormal findings   [facility] [address] [place], Salvadore Oxford, NP   1 year ago Pelvic pressure in female   [facility] [place], Salvadore Oxford, NP       Future Appointments             In 4 months Baity, Salvadore Oxford, NP [place] [facility], [facility]

## 2022-11-24 DIAGNOSIS — M9903 Segmental and somatic dysfunction of lumbar region: Secondary | ICD-10-CM | POA: Diagnosis not present

## 2022-11-24 DIAGNOSIS — M9901 Segmental and somatic dysfunction of cervical region: Secondary | ICD-10-CM | POA: Diagnosis not present

## 2022-11-24 DIAGNOSIS — M4004 Postural kyphosis, thoracic region: Secondary | ICD-10-CM | POA: Diagnosis not present

## 2022-11-24 DIAGNOSIS — M9902 Segmental and somatic dysfunction of thoracic region: Secondary | ICD-10-CM | POA: Diagnosis not present

## 2022-11-24 DIAGNOSIS — M9904 Segmental and somatic dysfunction of sacral region: Secondary | ICD-10-CM | POA: Diagnosis not present

## 2022-11-24 DIAGNOSIS — M5451 Vertebrogenic low back pain: Secondary | ICD-10-CM | POA: Diagnosis not present

## 2022-11-24 DIAGNOSIS — M531 Cervicobrachial syndrome: Secondary | ICD-10-CM | POA: Diagnosis not present

## 2022-11-25 DIAGNOSIS — M9903 Segmental and somatic dysfunction of lumbar region: Secondary | ICD-10-CM | POA: Diagnosis not present

## 2022-11-25 DIAGNOSIS — M9904 Segmental and somatic dysfunction of sacral region: Secondary | ICD-10-CM | POA: Diagnosis not present

## 2022-11-25 DIAGNOSIS — M9902 Segmental and somatic dysfunction of thoracic region: Secondary | ICD-10-CM | POA: Diagnosis not present

## 2022-11-25 DIAGNOSIS — M5451 Vertebrogenic low back pain: Secondary | ICD-10-CM | POA: Diagnosis not present

## 2022-11-25 DIAGNOSIS — M9901 Segmental and somatic dysfunction of cervical region: Secondary | ICD-10-CM | POA: Diagnosis not present

## 2022-11-25 DIAGNOSIS — M4004 Postural kyphosis, thoracic region: Secondary | ICD-10-CM | POA: Diagnosis not present

## 2022-11-25 DIAGNOSIS — M531 Cervicobrachial syndrome: Secondary | ICD-10-CM | POA: Diagnosis not present

## 2022-12-01 DIAGNOSIS — M5451 Vertebrogenic low back pain: Secondary | ICD-10-CM | POA: Diagnosis not present

## 2022-12-01 DIAGNOSIS — M4004 Postural kyphosis, thoracic region: Secondary | ICD-10-CM | POA: Diagnosis not present

## 2022-12-01 DIAGNOSIS — M9904 Segmental and somatic dysfunction of sacral region: Secondary | ICD-10-CM | POA: Diagnosis not present

## 2022-12-01 DIAGNOSIS — M9902 Segmental and somatic dysfunction of thoracic region: Secondary | ICD-10-CM | POA: Diagnosis not present

## 2022-12-01 DIAGNOSIS — M531 Cervicobrachial syndrome: Secondary | ICD-10-CM | POA: Diagnosis not present

## 2022-12-01 DIAGNOSIS — M9901 Segmental and somatic dysfunction of cervical region: Secondary | ICD-10-CM | POA: Diagnosis not present

## 2022-12-01 DIAGNOSIS — M9903 Segmental and somatic dysfunction of lumbar region: Secondary | ICD-10-CM | POA: Diagnosis not present

## 2022-12-03 DIAGNOSIS — M5451 Vertebrogenic low back pain: Secondary | ICD-10-CM | POA: Diagnosis not present

## 2022-12-03 DIAGNOSIS — M9903 Segmental and somatic dysfunction of lumbar region: Secondary | ICD-10-CM | POA: Diagnosis not present

## 2022-12-03 DIAGNOSIS — M531 Cervicobrachial syndrome: Secondary | ICD-10-CM | POA: Diagnosis not present

## 2022-12-03 DIAGNOSIS — M4004 Postural kyphosis, thoracic region: Secondary | ICD-10-CM | POA: Diagnosis not present

## 2022-12-03 DIAGNOSIS — M9901 Segmental and somatic dysfunction of cervical region: Secondary | ICD-10-CM | POA: Diagnosis not present

## 2022-12-03 DIAGNOSIS — M9904 Segmental and somatic dysfunction of sacral region: Secondary | ICD-10-CM | POA: Diagnosis not present

## 2022-12-03 DIAGNOSIS — M9902 Segmental and somatic dysfunction of thoracic region: Secondary | ICD-10-CM | POA: Diagnosis not present

## 2022-12-11 DIAGNOSIS — M9901 Segmental and somatic dysfunction of cervical region: Secondary | ICD-10-CM | POA: Diagnosis not present

## 2022-12-11 DIAGNOSIS — M9902 Segmental and somatic dysfunction of thoracic region: Secondary | ICD-10-CM | POA: Diagnosis not present

## 2022-12-11 DIAGNOSIS — M4004 Postural kyphosis, thoracic region: Secondary | ICD-10-CM | POA: Diagnosis not present

## 2022-12-11 DIAGNOSIS — M5451 Vertebrogenic low back pain: Secondary | ICD-10-CM | POA: Diagnosis not present

## 2022-12-11 DIAGNOSIS — M9904 Segmental and somatic dysfunction of sacral region: Secondary | ICD-10-CM | POA: Diagnosis not present

## 2022-12-11 DIAGNOSIS — M531 Cervicobrachial syndrome: Secondary | ICD-10-CM | POA: Diagnosis not present

## 2022-12-11 DIAGNOSIS — M9903 Segmental and somatic dysfunction of lumbar region: Secondary | ICD-10-CM | POA: Diagnosis not present

## 2022-12-23 ENCOUNTER — Ambulatory Visit: Payer: Self-pay

## 2022-12-23 NOTE — Telephone Encounter (Signed)
  Chief Complaint: Sore throat Symptoms: Dry cough, Wet cough, Fatigue, swollen glands Frequency: Since Thursday Pertinent Negatives: Patient denies fever Disposition: [] ED /[] Urgent Care (no appt availability in office) / [x] Appointment(In office/virtual)/ []  [place] [facility]/ [] Home Care/ [] Refused Recommended Disposition /[] [facility]/ []  Follow-up with PCP Additional Notes: Pt has had a sore throat and swollen glands for 5 days. Pt also reports a cough, both wet and dry. Appt made for Thursday with Erin Mecum at [facility]. Pt would like to be fit in on weds 8/14 at [facility] office. Please advise.    Reason for Disposition  [1] Sore throat is the only symptom AND [2] present > 48 hours  Answer Assessment - Initial Assessment Questions 1. ONSET: "When did the throat start hurting?" (Hours or days ago)       1 week 2. SEVERITY: "How bad is the sore throat?" (Scale 1-10; mild, moderate or severe)   - MILD (1-3):  Doesn't interfere with eating or normal activities.   - MODERATE (4-7): Interferes with eating some solids and normal activities.   - SEVERE (8-10):  Excruciating pain, interferes with most normal activities.   - SEVERE WITH DYSPHAGIA (10): Can't swallow liquids, drooling.     3-4/10 3. STREP EXPOSURE: "Has there been any exposure to strep within the past week?" If Yes, ask: "What type of contact occurred?"      Not that she is aware of 4.  VIRAL SYMPTOMS: "Are there any symptoms of a cold, such as a runny nose, cough, hoarse voice or red eyes?"      Cough - off and on productive cough 5. FEVER: "Do you have a fever?" If Yes, ask: "What is your temperature, how was it measured, and when did it start?"     no 6. PUS ON THE TONSILS: "Is there pus on the tonsils in the back of your throat?"     No tonsils 7. OTHER SYMPTOMS: "Do you have any other symptoms?" (e.g., difficulty breathing, headache, rash)     Difficulty swallowing - lethargic. Dry cough.  Protocols used:  Sore Throat-A-AH

## 2022-12-24 DIAGNOSIS — Z03818 Encounter for observation for suspected exposure to other biological agents ruled out: Secondary | ICD-10-CM | POA: Diagnosis not present

## 2022-12-24 DIAGNOSIS — J069 Acute upper respiratory infection, unspecified: Secondary | ICD-10-CM | POA: Diagnosis not present

## 2022-12-24 DIAGNOSIS — Z79899 Other long term (current) drug therapy: Secondary | ICD-10-CM | POA: Diagnosis not present

## 2022-12-24 NOTE — Telephone Encounter (Signed)
Called pt no answer LVMTCB, no availability with her PCP today. I left detailed vm to please keep her appt tomorrow with Denny Peon Mecum- PA.

## 2022-12-25 ENCOUNTER — Ambulatory Visit: Payer: Medicare HMO | Admitting: Physician Assistant

## 2023-01-08 ENCOUNTER — Encounter: Payer: Self-pay | Admitting: Internal Medicine

## 2023-01-08 MED ORDER — LEVOTHYROXINE SODIUM 50 MCG PO TABS: 50.0000 ug | ORAL_TABLET | Freq: Every day | ORAL | 1 refills | Status: AC

## 2023-01-08 NOTE — Addendum Note (Signed)
Addended by: Lorre Munroe on: 01/08/2023 12:30 PM   Modules accepted: Orders

## 2023-01-30 DIAGNOSIS — M9901 Segmental and somatic dysfunction of cervical region: Secondary | ICD-10-CM | POA: Diagnosis not present

## 2023-01-30 DIAGNOSIS — M5412 Radiculopathy, cervical region: Secondary | ICD-10-CM | POA: Diagnosis not present

## 2023-01-30 DIAGNOSIS — M5414 Radiculopathy, thoracic region: Secondary | ICD-10-CM | POA: Diagnosis not present

## 2023-01-30 DIAGNOSIS — Z79899 Other long term (current) drug therapy: Secondary | ICD-10-CM | POA: Diagnosis not present

## 2023-01-30 DIAGNOSIS — M9902 Segmental and somatic dysfunction of thoracic region: Secondary | ICD-10-CM | POA: Diagnosis not present

## 2023-02-11 DIAGNOSIS — M9901 Segmental and somatic dysfunction of cervical region: Secondary | ICD-10-CM | POA: Diagnosis not present

## 2023-02-11 DIAGNOSIS — I781 Nevus, non-neoplastic: Secondary | ICD-10-CM | POA: Diagnosis not present

## 2023-02-11 DIAGNOSIS — D485 Neoplasm of uncertain behavior of skin: Secondary | ICD-10-CM | POA: Diagnosis not present

## 2023-02-11 DIAGNOSIS — L570 Actinic keratosis: Secondary | ICD-10-CM | POA: Diagnosis not present

## 2023-02-11 DIAGNOSIS — M9904 Segmental and somatic dysfunction of sacral region: Secondary | ICD-10-CM | POA: Diagnosis not present

## 2023-02-11 DIAGNOSIS — Z85828 Personal history of other malignant neoplasm of skin: Secondary | ICD-10-CM | POA: Diagnosis not present

## 2023-02-11 DIAGNOSIS — M9902 Segmental and somatic dysfunction of thoracic region: Secondary | ICD-10-CM | POA: Diagnosis not present

## 2023-02-11 DIAGNOSIS — M531 Cervicobrachial syndrome: Secondary | ICD-10-CM | POA: Diagnosis not present

## 2023-02-11 DIAGNOSIS — L404 Guttate psoriasis: Secondary | ICD-10-CM | POA: Diagnosis not present

## 2023-02-11 DIAGNOSIS — M4004 Postural kyphosis, thoracic region: Secondary | ICD-10-CM | POA: Diagnosis not present

## 2023-02-11 DIAGNOSIS — L820 Inflamed seborrheic keratosis: Secondary | ICD-10-CM | POA: Diagnosis not present

## 2023-02-11 DIAGNOSIS — M9903 Segmental and somatic dysfunction of lumbar region: Secondary | ICD-10-CM | POA: Diagnosis not present

## 2023-02-11 IMAGING — MG MM DIGITAL SCREENING BILAT W/ TOMO AND CAD
6 of 10 series · 6 of 30 positions shown · non-contrast
Comparison: Previous exam(s).

CLINICAL DATA: Screening.

EXAM:
DIGITAL SCREENING BILATERAL MAMMOGRAM WITH TOMOSYNTHESIS AND CAD
TECHNIQUE: Bilateral screening digital craniocaudal and mediolateral oblique
mammograms were obtained. Bilateral screening digital breast
tomosynthesis was performed. The images were evaluated with
computer-aided detection.

[L CC synth-2D]
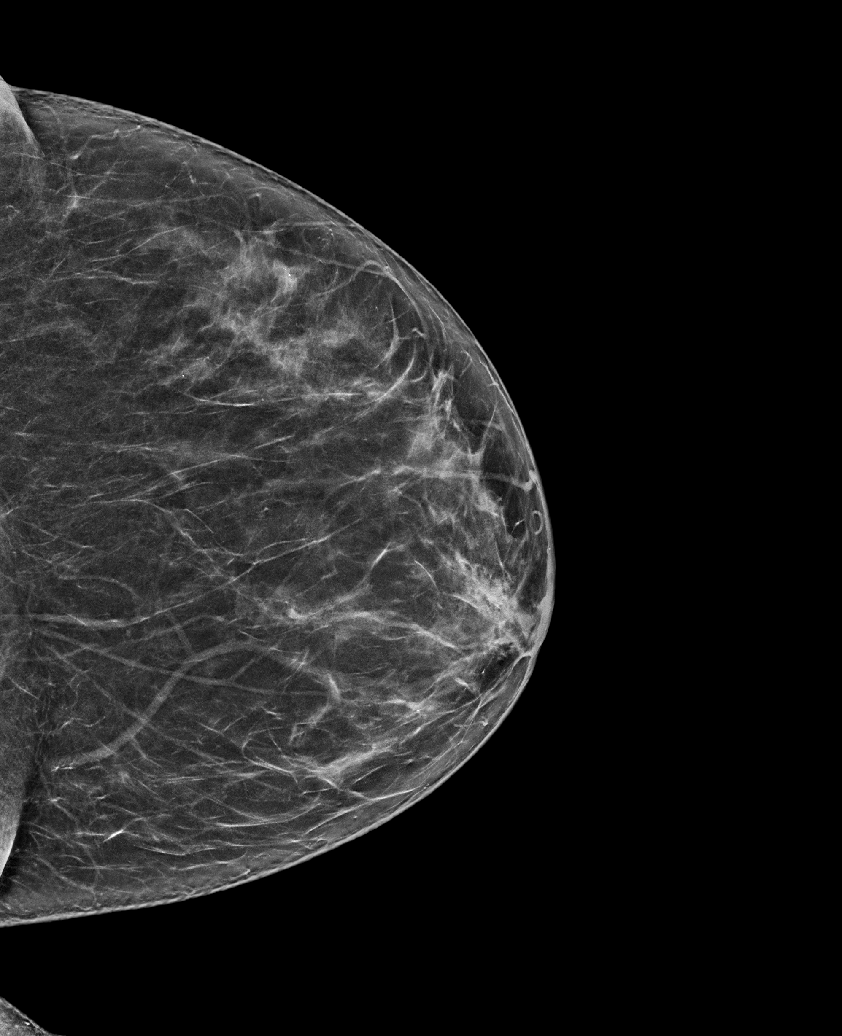

[L MLO synth-2D]
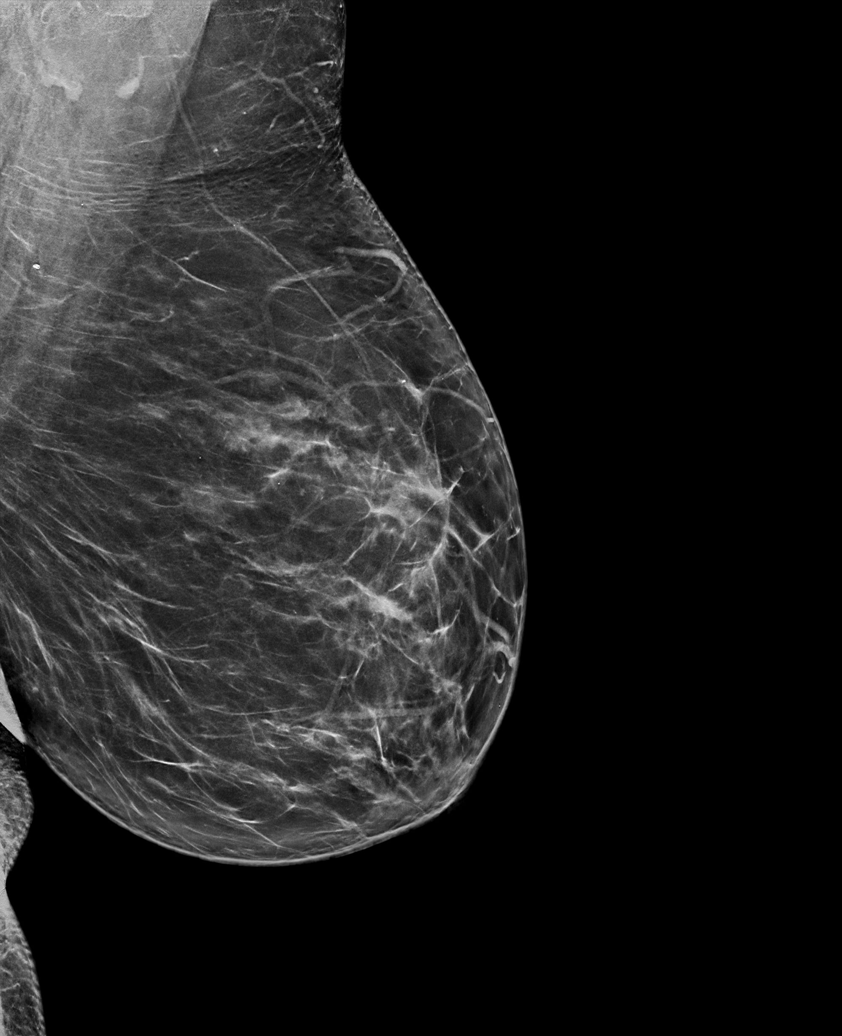

[R MLO synth-2D]
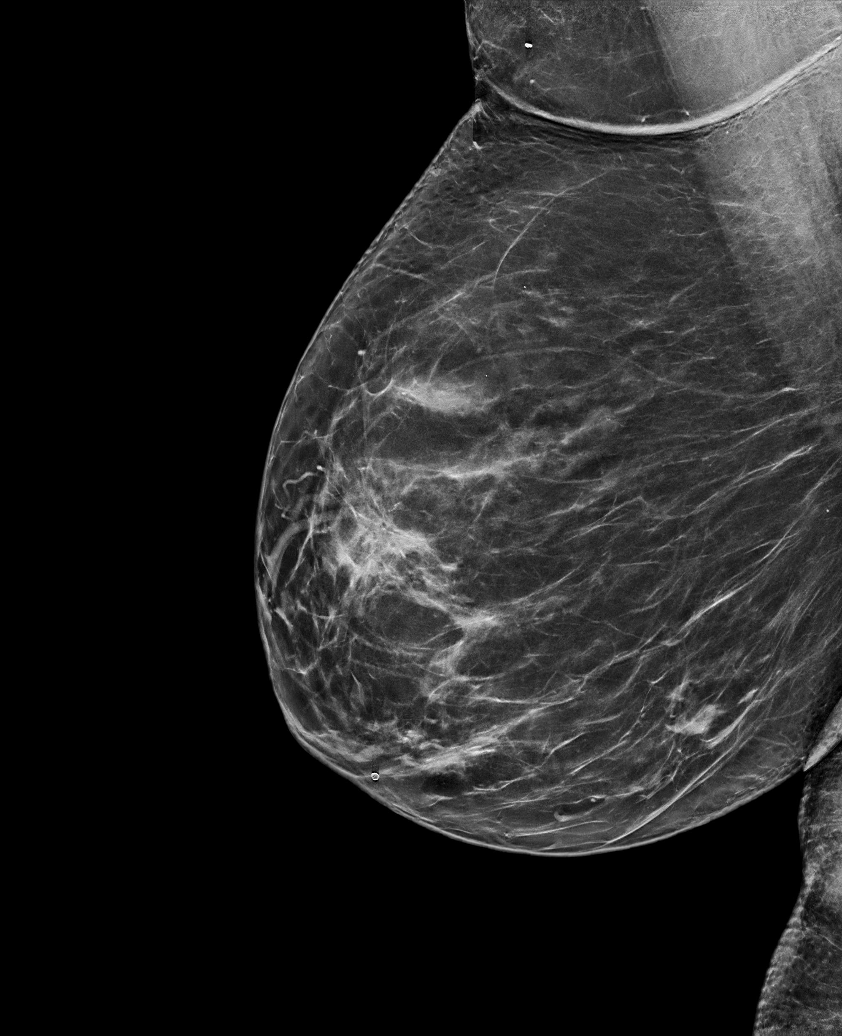

[R CC synth-2D]
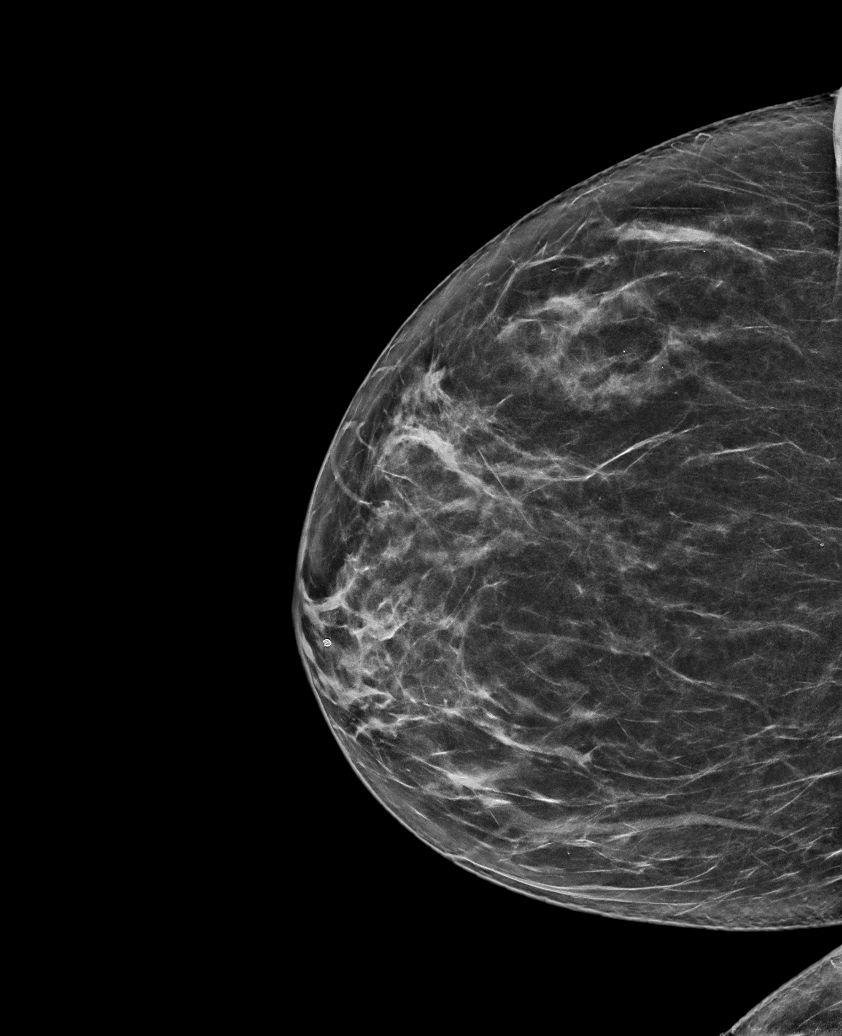

[R XCCL synth-2D]
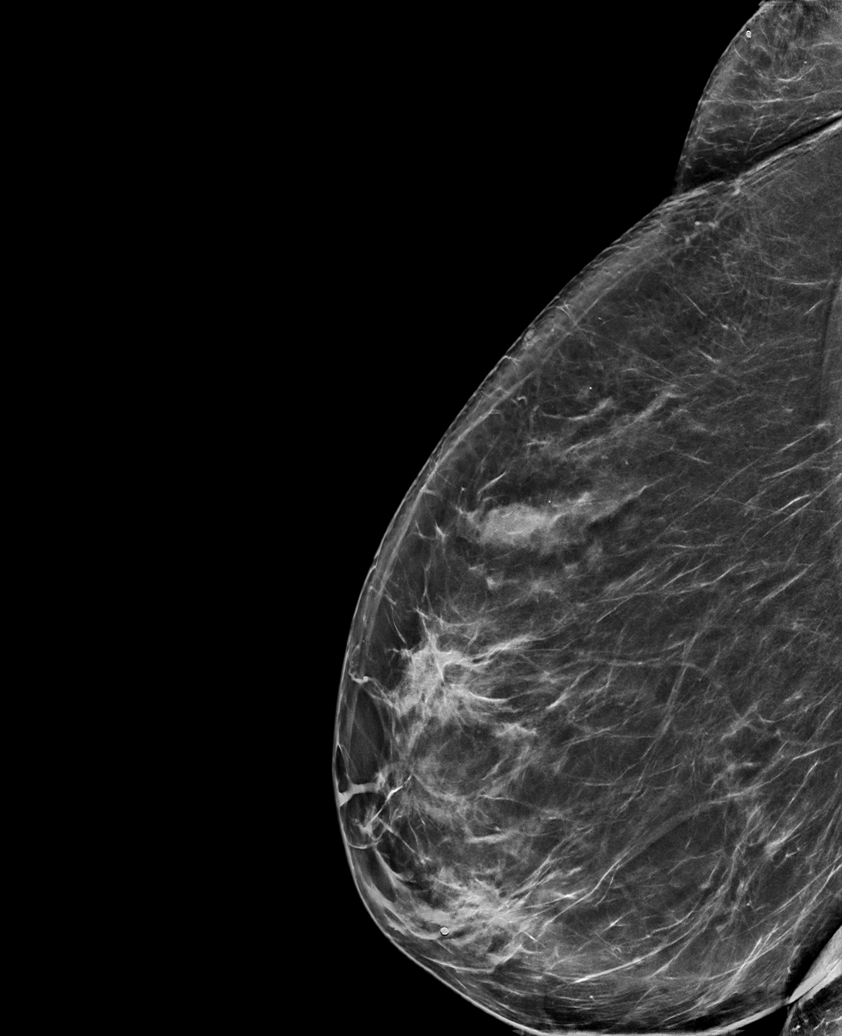

[R MLO tomo · tomo slice 44/87.0]
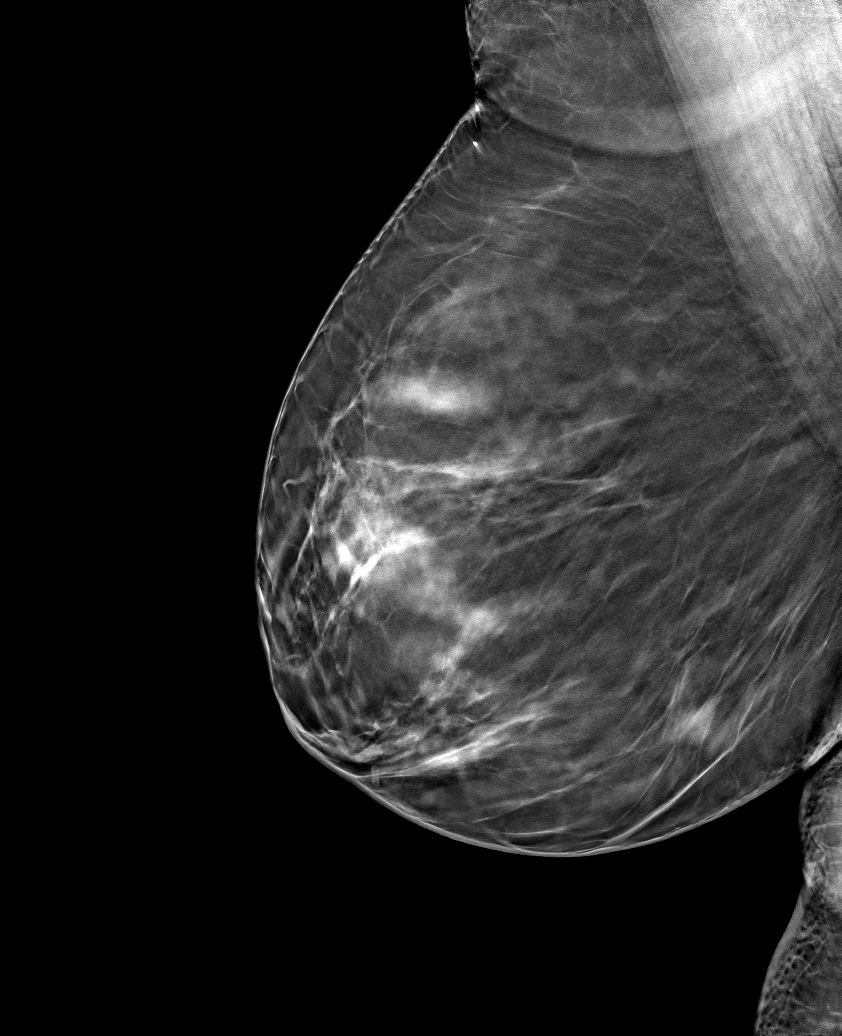

[6 of 30 positions shown; findings below may reference images not displayed]

ACR Breast Density Category b: There are scattered areas of
fibroglandular density.
FINDINGS: There are no findings suspicious for malignancy.
IMPRESSION: No mammographic evidence of malignancy. A result letter of this
screening mammogram will be mailed directly to the patient.

RECOMMENDATION:
Screening mammogram in one year. (Code:51-O-LD2)

BI-RADS CATEGORY  1: Negative.

## 2023-03-02 DIAGNOSIS — M4004 Postural kyphosis, thoracic region: Secondary | ICD-10-CM | POA: Diagnosis not present

## 2023-03-02 DIAGNOSIS — M9903 Segmental and somatic dysfunction of lumbar region: Secondary | ICD-10-CM | POA: Diagnosis not present

## 2023-03-02 DIAGNOSIS — M9901 Segmental and somatic dysfunction of cervical region: Secondary | ICD-10-CM | POA: Diagnosis not present

## 2023-03-02 DIAGNOSIS — M531 Cervicobrachial syndrome: Secondary | ICD-10-CM | POA: Diagnosis not present

## 2023-03-02 DIAGNOSIS — M9902 Segmental and somatic dysfunction of thoracic region: Secondary | ICD-10-CM | POA: Diagnosis not present

## 2023-03-02 DIAGNOSIS — M9904 Segmental and somatic dysfunction of sacral region: Secondary | ICD-10-CM | POA: Diagnosis not present

## 2023-03-19 DIAGNOSIS — M5412 Radiculopathy, cervical region: Secondary | ICD-10-CM | POA: Diagnosis not present

## 2023-03-19 DIAGNOSIS — M13842 Other specified arthritis, left hand: Secondary | ICD-10-CM | POA: Diagnosis not present

## 2023-03-19 DIAGNOSIS — M47812 Spondylosis without myelopathy or radiculopathy, cervical region: Secondary | ICD-10-CM | POA: Diagnosis not present

## 2023-03-19 DIAGNOSIS — M7551 Bursitis of right shoulder: Secondary | ICD-10-CM | POA: Diagnosis not present

## 2023-03-30 DIAGNOSIS — Z8719 Personal history of other diseases of the digestive system: Secondary | ICD-10-CM | POA: Diagnosis not present

## 2023-03-30 DIAGNOSIS — E079 Disorder of thyroid, unspecified: Secondary | ICD-10-CM | POA: Diagnosis not present

## 2023-03-30 DIAGNOSIS — E785 Hyperlipidemia, unspecified: Secondary | ICD-10-CM | POA: Diagnosis not present

## 2023-03-30 DIAGNOSIS — Z79899 Other long term (current) drug therapy: Secondary | ICD-10-CM | POA: Diagnosis not present

## 2023-03-30 DIAGNOSIS — Z23 Encounter for immunization: Secondary | ICD-10-CM | POA: Diagnosis not present

## 2023-03-30 DIAGNOSIS — F411 Generalized anxiety disorder: Secondary | ICD-10-CM | POA: Diagnosis not present

## 2023-03-30 DIAGNOSIS — Z9889 Other specified postprocedural states: Secondary | ICD-10-CM | POA: Diagnosis not present

## 2023-03-30 DIAGNOSIS — K219 Gastro-esophageal reflux disease without esophagitis: Secondary | ICD-10-CM | POA: Diagnosis not present

## 2023-03-30 DIAGNOSIS — Z0000 Encounter for general adult medical examination without abnormal findings: Secondary | ICD-10-CM | POA: Diagnosis not present

## 2023-03-31 ENCOUNTER — Encounter: Payer: Medicare HMO | Admitting: Internal Medicine

## 2023-04-01 DIAGNOSIS — E039 Hypothyroidism, unspecified: Secondary | ICD-10-CM | POA: Diagnosis not present

## 2023-04-01 DIAGNOSIS — Z79899 Other long term (current) drug therapy: Secondary | ICD-10-CM | POA: Diagnosis not present

## 2023-04-01 DIAGNOSIS — E782 Mixed hyperlipidemia: Secondary | ICD-10-CM | POA: Diagnosis not present

## 2023-04-01 DIAGNOSIS — R7303 Prediabetes: Secondary | ICD-10-CM | POA: Diagnosis not present

## 2023-04-27 DIAGNOSIS — M9901 Segmental and somatic dysfunction of cervical region: Secondary | ICD-10-CM | POA: Diagnosis not present

## 2023-04-27 DIAGNOSIS — M9902 Segmental and somatic dysfunction of thoracic region: Secondary | ICD-10-CM | POA: Diagnosis not present

## 2023-04-27 DIAGNOSIS — M5414 Radiculopathy, thoracic region: Secondary | ICD-10-CM | POA: Diagnosis not present

## 2023-04-27 DIAGNOSIS — M5412 Radiculopathy, cervical region: Secondary | ICD-10-CM | POA: Diagnosis not present

## 2023-04-29 DIAGNOSIS — M5412 Radiculopathy, cervical region: Secondary | ICD-10-CM | POA: Diagnosis not present

## 2023-04-29 DIAGNOSIS — M9901 Segmental and somatic dysfunction of cervical region: Secondary | ICD-10-CM | POA: Diagnosis not present

## 2023-04-29 DIAGNOSIS — M9902 Segmental and somatic dysfunction of thoracic region: Secondary | ICD-10-CM | POA: Diagnosis not present

## 2023-04-29 DIAGNOSIS — M5414 Radiculopathy, thoracic region: Secondary | ICD-10-CM | POA: Diagnosis not present

## 2023-05-04 DIAGNOSIS — M5412 Radiculopathy, cervical region: Secondary | ICD-10-CM | POA: Diagnosis not present

## 2023-05-04 DIAGNOSIS — M5414 Radiculopathy, thoracic region: Secondary | ICD-10-CM | POA: Diagnosis not present

## 2023-05-04 DIAGNOSIS — M9902 Segmental and somatic dysfunction of thoracic region: Secondary | ICD-10-CM | POA: Diagnosis not present

## 2023-05-04 DIAGNOSIS — M9901 Segmental and somatic dysfunction of cervical region: Secondary | ICD-10-CM | POA: Diagnosis not present

## 2023-05-08 DIAGNOSIS — M9902 Segmental and somatic dysfunction of thoracic region: Secondary | ICD-10-CM | POA: Diagnosis not present

## 2023-05-08 DIAGNOSIS — M5414 Radiculopathy, thoracic region: Secondary | ICD-10-CM | POA: Diagnosis not present

## 2023-05-08 DIAGNOSIS — M9901 Segmental and somatic dysfunction of cervical region: Secondary | ICD-10-CM | POA: Diagnosis not present

## 2023-05-08 DIAGNOSIS — M5412 Radiculopathy, cervical region: Secondary | ICD-10-CM | POA: Diagnosis not present

## 2023-05-12 DIAGNOSIS — M5414 Radiculopathy, thoracic region: Secondary | ICD-10-CM | POA: Diagnosis not present

## 2023-05-12 DIAGNOSIS — M9901 Segmental and somatic dysfunction of cervical region: Secondary | ICD-10-CM | POA: Diagnosis not present

## 2023-05-12 DIAGNOSIS — M9902 Segmental and somatic dysfunction of thoracic region: Secondary | ICD-10-CM | POA: Diagnosis not present

## 2023-05-12 DIAGNOSIS — M5412 Radiculopathy, cervical region: Secondary | ICD-10-CM | POA: Diagnosis not present

## 2023-06-09 DIAGNOSIS — H2512 Age-related nuclear cataract, left eye: Secondary | ICD-10-CM | POA: Diagnosis not present

## 2023-06-09 DIAGNOSIS — Z961 Presence of intraocular lens: Secondary | ICD-10-CM | POA: Diagnosis not present

## 2023-06-25 DIAGNOSIS — E039 Hypothyroidism, unspecified: Secondary | ICD-10-CM | POA: Diagnosis not present

## 2023-06-25 DIAGNOSIS — E782 Mixed hyperlipidemia: Secondary | ICD-10-CM | POA: Diagnosis not present

## 2023-06-25 DIAGNOSIS — Z79899 Other long term (current) drug therapy: Secondary | ICD-10-CM | POA: Diagnosis not present

## 2023-07-17 DIAGNOSIS — F411 Generalized anxiety disorder: Secondary | ICD-10-CM | POA: Diagnosis not present

## 2023-07-17 DIAGNOSIS — K219 Gastro-esophageal reflux disease without esophagitis: Secondary | ICD-10-CM | POA: Diagnosis not present

## 2023-07-17 DIAGNOSIS — E039 Hypothyroidism, unspecified: Secondary | ICD-10-CM | POA: Diagnosis not present

## 2023-07-17 DIAGNOSIS — E7800 Pure hypercholesterolemia, unspecified: Secondary | ICD-10-CM | POA: Diagnosis not present

## 2023-08-12 DIAGNOSIS — L578 Other skin changes due to chronic exposure to nonionizing radiation: Secondary | ICD-10-CM | POA: Diagnosis not present

## 2023-08-12 DIAGNOSIS — Z85828 Personal history of other malignant neoplasm of skin: Secondary | ICD-10-CM | POA: Diagnosis not present

## 2023-08-12 DIAGNOSIS — L820 Inflamed seborrheic keratosis: Secondary | ICD-10-CM | POA: Diagnosis not present

## 2023-08-12 DIAGNOSIS — D229 Melanocytic nevi, unspecified: Secondary | ICD-10-CM | POA: Diagnosis not present

## 2023-08-12 DIAGNOSIS — R61 Generalized hyperhidrosis: Secondary | ICD-10-CM | POA: Diagnosis not present

## 2023-08-12 DIAGNOSIS — Z808 Family history of malignant neoplasm of other organs or systems: Secondary | ICD-10-CM | POA: Diagnosis not present

## 2023-08-12 DIAGNOSIS — L814 Other melanin hyperpigmentation: Secondary | ICD-10-CM | POA: Diagnosis not present

## 2023-08-12 DIAGNOSIS — L821 Other seborrheic keratosis: Secondary | ICD-10-CM | POA: Diagnosis not present

## 2023-08-26 ENCOUNTER — Other Ambulatory Visit: Payer: Self-pay | Admitting: Internal Medicine

## 2023-08-26 DIAGNOSIS — Z1231 Encounter for screening mammogram for malignant neoplasm of breast: Secondary | ICD-10-CM

## 2023-09-03 DIAGNOSIS — M5412 Radiculopathy, cervical region: Secondary | ICD-10-CM | POA: Diagnosis not present

## 2023-09-03 DIAGNOSIS — M5414 Radiculopathy, thoracic region: Secondary | ICD-10-CM | POA: Diagnosis not present

## 2023-09-03 DIAGNOSIS — M9901 Segmental and somatic dysfunction of cervical region: Secondary | ICD-10-CM | POA: Diagnosis not present

## 2023-09-03 DIAGNOSIS — M9902 Segmental and somatic dysfunction of thoracic region: Secondary | ICD-10-CM | POA: Diagnosis not present

## 2023-09-18 DIAGNOSIS — M5489 Other dorsalgia: Secondary | ICD-10-CM | POA: Diagnosis not present

## 2023-09-22 ENCOUNTER — Ambulatory Visit
Admission: RE | Admit: 2023-09-22 | Discharge: 2023-09-22 | Disposition: A | Discharge status: Home / Self Care | Source: Ambulatory Visit | Attending: Internal Medicine | Admitting: Internal Medicine

## 2023-09-22 DIAGNOSIS — Z1231 Encounter for screening mammogram for malignant neoplasm of breast: Secondary | ICD-10-CM | POA: Insufficient documentation

## 2023-10-08 DIAGNOSIS — M9901 Segmental and somatic dysfunction of cervical region: Secondary | ICD-10-CM | POA: Diagnosis not present

## 2023-10-08 DIAGNOSIS — M5412 Radiculopathy, cervical region: Secondary | ICD-10-CM | POA: Diagnosis not present

## 2023-10-08 DIAGNOSIS — M5414 Radiculopathy, thoracic region: Secondary | ICD-10-CM | POA: Diagnosis not present

## 2023-10-08 DIAGNOSIS — M9902 Segmental and somatic dysfunction of thoracic region: Secondary | ICD-10-CM | POA: Diagnosis not present

## 2023-10-12 DIAGNOSIS — M9902 Segmental and somatic dysfunction of thoracic region: Secondary | ICD-10-CM | POA: Diagnosis not present

## 2023-10-12 DIAGNOSIS — M9901 Segmental and somatic dysfunction of cervical region: Secondary | ICD-10-CM | POA: Diagnosis not present

## 2023-10-12 DIAGNOSIS — M5412 Radiculopathy, cervical region: Secondary | ICD-10-CM | POA: Diagnosis not present

## 2023-10-12 DIAGNOSIS — M5414 Radiculopathy, thoracic region: Secondary | ICD-10-CM | POA: Diagnosis not present

## 2023-10-14 DIAGNOSIS — M5412 Radiculopathy, cervical region: Secondary | ICD-10-CM | POA: Diagnosis not present

## 2023-10-14 DIAGNOSIS — M9902 Segmental and somatic dysfunction of thoracic region: Secondary | ICD-10-CM | POA: Diagnosis not present

## 2023-10-14 DIAGNOSIS — M9901 Segmental and somatic dysfunction of cervical region: Secondary | ICD-10-CM | POA: Diagnosis not present

## 2023-10-14 DIAGNOSIS — M5414 Radiculopathy, thoracic region: Secondary | ICD-10-CM | POA: Diagnosis not present

## 2023-10-19 DIAGNOSIS — M9901 Segmental and somatic dysfunction of cervical region: Secondary | ICD-10-CM | POA: Diagnosis not present

## 2023-10-19 DIAGNOSIS — M5414 Radiculopathy, thoracic region: Secondary | ICD-10-CM | POA: Diagnosis not present

## 2023-10-19 DIAGNOSIS — M5412 Radiculopathy, cervical region: Secondary | ICD-10-CM | POA: Diagnosis not present

## 2023-10-19 DIAGNOSIS — M9902 Segmental and somatic dysfunction of thoracic region: Secondary | ICD-10-CM | POA: Diagnosis not present

## 2023-11-05 DIAGNOSIS — M9913 Subluxation complex (vertebral) of lumbar region: Secondary | ICD-10-CM | POA: Diagnosis not present

## 2023-11-05 DIAGNOSIS — M9911 Subluxation complex (vertebral) of cervical region: Secondary | ICD-10-CM | POA: Diagnosis not present

## 2023-11-05 DIAGNOSIS — M51361 Other intervertebral disc degeneration, lumbar region with lower extremity pain only: Secondary | ICD-10-CM | POA: Diagnosis not present

## 2023-11-10 DIAGNOSIS — M51361 Other intervertebral disc degeneration, lumbar region with lower extremity pain only: Secondary | ICD-10-CM | POA: Diagnosis not present

## 2023-11-10 DIAGNOSIS — M9911 Subluxation complex (vertebral) of cervical region: Secondary | ICD-10-CM | POA: Diagnosis not present

## 2023-11-10 DIAGNOSIS — M9913 Subluxation complex (vertebral) of lumbar region: Secondary | ICD-10-CM | POA: Diagnosis not present

## 2023-11-11 DIAGNOSIS — M9911 Subluxation complex (vertebral) of cervical region: Secondary | ICD-10-CM | POA: Diagnosis not present

## 2023-11-11 DIAGNOSIS — M9913 Subluxation complex (vertebral) of lumbar region: Secondary | ICD-10-CM | POA: Diagnosis not present

## 2023-11-11 DIAGNOSIS — M51361 Other intervertebral disc degeneration, lumbar region with lower extremity pain only: Secondary | ICD-10-CM | POA: Diagnosis not present

## 2023-11-16 DIAGNOSIS — M51361 Other intervertebral disc degeneration, lumbar region with lower extremity pain only: Secondary | ICD-10-CM | POA: Diagnosis not present

## 2023-11-16 DIAGNOSIS — M9913 Subluxation complex (vertebral) of lumbar region: Secondary | ICD-10-CM | POA: Diagnosis not present

## 2023-11-16 DIAGNOSIS — M9911 Subluxation complex (vertebral) of cervical region: Secondary | ICD-10-CM | POA: Diagnosis not present

## 2023-11-19 DIAGNOSIS — M51361 Other intervertebral disc degeneration, lumbar region with lower extremity pain only: Secondary | ICD-10-CM | POA: Diagnosis not present

## 2023-11-19 DIAGNOSIS — M9913 Subluxation complex (vertebral) of lumbar region: Secondary | ICD-10-CM | POA: Diagnosis not present

## 2023-11-19 DIAGNOSIS — M9911 Subluxation complex (vertebral) of cervical region: Secondary | ICD-10-CM | POA: Diagnosis not present

## 2023-11-23 DIAGNOSIS — M9911 Subluxation complex (vertebral) of cervical region: Secondary | ICD-10-CM | POA: Diagnosis not present

## 2023-11-23 DIAGNOSIS — M9913 Subluxation complex (vertebral) of lumbar region: Secondary | ICD-10-CM | POA: Diagnosis not present

## 2023-11-23 DIAGNOSIS — M51361 Other intervertebral disc degeneration, lumbar region with lower extremity pain only: Secondary | ICD-10-CM | POA: Diagnosis not present

## 2023-11-24 DIAGNOSIS — M51361 Other intervertebral disc degeneration, lumbar region with lower extremity pain only: Secondary | ICD-10-CM | POA: Diagnosis not present

## 2023-11-24 DIAGNOSIS — M9911 Subluxation complex (vertebral) of cervical region: Secondary | ICD-10-CM | POA: Diagnosis not present

## 2023-11-24 DIAGNOSIS — M9913 Subluxation complex (vertebral) of lumbar region: Secondary | ICD-10-CM | POA: Diagnosis not present

## 2023-11-30 DIAGNOSIS — M9913 Subluxation complex (vertebral) of lumbar region: Secondary | ICD-10-CM | POA: Diagnosis not present

## 2023-11-30 DIAGNOSIS — M9911 Subluxation complex (vertebral) of cervical region: Secondary | ICD-10-CM | POA: Diagnosis not present

## 2023-11-30 DIAGNOSIS — M51361 Other intervertebral disc degeneration, lumbar region with lower extremity pain only: Secondary | ICD-10-CM | POA: Diagnosis not present

## 2023-12-02 DIAGNOSIS — M9911 Subluxation complex (vertebral) of cervical region: Secondary | ICD-10-CM | POA: Diagnosis not present

## 2023-12-02 DIAGNOSIS — M9913 Subluxation complex (vertebral) of lumbar region: Secondary | ICD-10-CM | POA: Diagnosis not present

## 2023-12-02 DIAGNOSIS — M51361 Other intervertebral disc degeneration, lumbar region with lower extremity pain only: Secondary | ICD-10-CM | POA: Diagnosis not present

## 2023-12-16 DIAGNOSIS — M51361 Other intervertebral disc degeneration, lumbar region with lower extremity pain only: Secondary | ICD-10-CM | POA: Diagnosis not present

## 2023-12-16 DIAGNOSIS — M9913 Subluxation complex (vertebral) of lumbar region: Secondary | ICD-10-CM | POA: Diagnosis not present

## 2023-12-16 DIAGNOSIS — M9911 Subluxation complex (vertebral) of cervical region: Secondary | ICD-10-CM | POA: Diagnosis not present

## 2023-12-17 DIAGNOSIS — M51361 Other intervertebral disc degeneration, lumbar region with lower extremity pain only: Secondary | ICD-10-CM | POA: Diagnosis not present

## 2023-12-17 DIAGNOSIS — M9913 Subluxation complex (vertebral) of lumbar region: Secondary | ICD-10-CM | POA: Diagnosis not present

## 2023-12-17 DIAGNOSIS — M9911 Subluxation complex (vertebral) of cervical region: Secondary | ICD-10-CM | POA: Diagnosis not present

## 2023-12-21 DIAGNOSIS — M51361 Other intervertebral disc degeneration, lumbar region with lower extremity pain only: Secondary | ICD-10-CM | POA: Diagnosis not present

## 2023-12-21 DIAGNOSIS — M9913 Subluxation complex (vertebral) of lumbar region: Secondary | ICD-10-CM | POA: Diagnosis not present

## 2023-12-21 DIAGNOSIS — M9911 Subluxation complex (vertebral) of cervical region: Secondary | ICD-10-CM | POA: Diagnosis not present

## 2023-12-24 DIAGNOSIS — M9913 Subluxation complex (vertebral) of lumbar region: Secondary | ICD-10-CM | POA: Diagnosis not present

## 2023-12-24 DIAGNOSIS — M51361 Other intervertebral disc degeneration, lumbar region with lower extremity pain only: Secondary | ICD-10-CM | POA: Diagnosis not present

## 2023-12-24 DIAGNOSIS — M9911 Subluxation complex (vertebral) of cervical region: Secondary | ICD-10-CM | POA: Diagnosis not present

## 2023-12-28 DIAGNOSIS — M51361 Other intervertebral disc degeneration, lumbar region with lower extremity pain only: Secondary | ICD-10-CM | POA: Diagnosis not present

## 2023-12-28 DIAGNOSIS — M9913 Subluxation complex (vertebral) of lumbar region: Secondary | ICD-10-CM | POA: Diagnosis not present

## 2023-12-28 DIAGNOSIS — M9911 Subluxation complex (vertebral) of cervical region: Secondary | ICD-10-CM | POA: Diagnosis not present

## 2023-12-31 DIAGNOSIS — M9913 Subluxation complex (vertebral) of lumbar region: Secondary | ICD-10-CM | POA: Diagnosis not present

## 2023-12-31 DIAGNOSIS — M9911 Subluxation complex (vertebral) of cervical region: Secondary | ICD-10-CM | POA: Diagnosis not present

## 2023-12-31 DIAGNOSIS — M51361 Other intervertebral disc degeneration, lumbar region with lower extremity pain only: Secondary | ICD-10-CM | POA: Diagnosis not present

## 2024-01-04 DIAGNOSIS — M9913 Subluxation complex (vertebral) of lumbar region: Secondary | ICD-10-CM | POA: Diagnosis not present

## 2024-01-04 DIAGNOSIS — M9911 Subluxation complex (vertebral) of cervical region: Secondary | ICD-10-CM | POA: Diagnosis not present

## 2024-01-04 DIAGNOSIS — M51361 Other intervertebral disc degeneration, lumbar region with lower extremity pain only: Secondary | ICD-10-CM | POA: Diagnosis not present

## 2024-01-05 DIAGNOSIS — M9911 Subluxation complex (vertebral) of cervical region: Secondary | ICD-10-CM | POA: Diagnosis not present

## 2024-01-05 DIAGNOSIS — M9913 Subluxation complex (vertebral) of lumbar region: Secondary | ICD-10-CM | POA: Diagnosis not present

## 2024-01-05 DIAGNOSIS — M51361 Other intervertebral disc degeneration, lumbar region with lower extremity pain only: Secondary | ICD-10-CM | POA: Diagnosis not present

## 2024-01-07 DIAGNOSIS — M51361 Other intervertebral disc degeneration, lumbar region with lower extremity pain only: Secondary | ICD-10-CM | POA: Diagnosis not present

## 2024-01-07 DIAGNOSIS — M9913 Subluxation complex (vertebral) of lumbar region: Secondary | ICD-10-CM | POA: Diagnosis not present

## 2024-01-07 DIAGNOSIS — M9911 Subluxation complex (vertebral) of cervical region: Secondary | ICD-10-CM | POA: Diagnosis not present

## 2024-01-12 DIAGNOSIS — M51361 Other intervertebral disc degeneration, lumbar region with lower extremity pain only: Secondary | ICD-10-CM | POA: Diagnosis not present

## 2024-01-12 DIAGNOSIS — M9913 Subluxation complex (vertebral) of lumbar region: Secondary | ICD-10-CM | POA: Diagnosis not present

## 2024-01-12 DIAGNOSIS — M9911 Subluxation complex (vertebral) of cervical region: Secondary | ICD-10-CM | POA: Diagnosis not present

## 2024-01-14 DIAGNOSIS — M9911 Subluxation complex (vertebral) of cervical region: Secondary | ICD-10-CM | POA: Diagnosis not present

## 2024-01-14 DIAGNOSIS — M9913 Subluxation complex (vertebral) of lumbar region: Secondary | ICD-10-CM | POA: Diagnosis not present

## 2024-01-14 DIAGNOSIS — M51361 Other intervertebral disc degeneration, lumbar region with lower extremity pain only: Secondary | ICD-10-CM | POA: Diagnosis not present

## 2024-01-18 DIAGNOSIS — E782 Mixed hyperlipidemia: Secondary | ICD-10-CM | POA: Diagnosis not present

## 2024-01-18 DIAGNOSIS — Z79899 Other long term (current) drug therapy: Secondary | ICD-10-CM | POA: Diagnosis not present

## 2024-01-18 DIAGNOSIS — E039 Hypothyroidism, unspecified: Secondary | ICD-10-CM | POA: Diagnosis not present

## 2024-01-18 DIAGNOSIS — M549 Dorsalgia, unspecified: Secondary | ICD-10-CM | POA: Diagnosis not present

## 2024-01-18 DIAGNOSIS — M51361 Other intervertebral disc degeneration, lumbar region with lower extremity pain only: Secondary | ICD-10-CM | POA: Diagnosis not present

## 2024-01-18 DIAGNOSIS — Z13820 Encounter for screening for osteoporosis: Secondary | ICD-10-CM | POA: Diagnosis not present

## 2024-01-18 DIAGNOSIS — M9911 Subluxation complex (vertebral) of cervical region: Secondary | ICD-10-CM | POA: Diagnosis not present

## 2024-01-18 DIAGNOSIS — Z0000 Encounter for general adult medical examination without abnormal findings: Secondary | ICD-10-CM | POA: Diagnosis not present

## 2024-01-18 DIAGNOSIS — M9913 Subluxation complex (vertebral) of lumbar region: Secondary | ICD-10-CM | POA: Diagnosis not present

## 2024-01-18 DIAGNOSIS — F411 Generalized anxiety disorder: Secondary | ICD-10-CM | POA: Diagnosis not present

## 2024-01-18 DIAGNOSIS — K219 Gastro-esophageal reflux disease without esophagitis: Secondary | ICD-10-CM | POA: Diagnosis not present

## 2024-01-18 DIAGNOSIS — R7303 Prediabetes: Secondary | ICD-10-CM | POA: Diagnosis not present

## 2024-01-25 DIAGNOSIS — M9913 Subluxation complex (vertebral) of lumbar region: Secondary | ICD-10-CM | POA: Diagnosis not present

## 2024-01-25 DIAGNOSIS — M9911 Subluxation complex (vertebral) of cervical region: Secondary | ICD-10-CM | POA: Diagnosis not present

## 2024-01-25 DIAGNOSIS — M51361 Other intervertebral disc degeneration, lumbar region with lower extremity pain only: Secondary | ICD-10-CM | POA: Diagnosis not present

## 2024-01-26 DIAGNOSIS — M8588 Other specified disorders of bone density and structure, other site: Secondary | ICD-10-CM | POA: Diagnosis not present

## 2024-02-02 DIAGNOSIS — M5412 Radiculopathy, cervical region: Secondary | ICD-10-CM | POA: Diagnosis not present

## 2024-02-02 DIAGNOSIS — M5441 Lumbago with sciatica, right side: Secondary | ICD-10-CM | POA: Diagnosis not present

## 2024-02-02 DIAGNOSIS — M542 Cervicalgia: Secondary | ICD-10-CM | POA: Diagnosis not present

## 2024-02-02 DIAGNOSIS — G8929 Other chronic pain: Secondary | ICD-10-CM | POA: Diagnosis not present

## 2024-02-02 DIAGNOSIS — M5442 Lumbago with sciatica, left side: Secondary | ICD-10-CM | POA: Diagnosis not present

## 2024-02-04 DIAGNOSIS — M9911 Subluxation complex (vertebral) of cervical region: Secondary | ICD-10-CM | POA: Diagnosis not present

## 2024-02-04 DIAGNOSIS — M51361 Other intervertebral disc degeneration, lumbar region with lower extremity pain only: Secondary | ICD-10-CM | POA: Diagnosis not present

## 2024-02-04 DIAGNOSIS — M9913 Subluxation complex (vertebral) of lumbar region: Secondary | ICD-10-CM | POA: Diagnosis not present

## 2024-02-08 DIAGNOSIS — M51361 Other intervertebral disc degeneration, lumbar region with lower extremity pain only: Secondary | ICD-10-CM | POA: Diagnosis not present

## 2024-02-08 DIAGNOSIS — M9911 Subluxation complex (vertebral) of cervical region: Secondary | ICD-10-CM | POA: Diagnosis not present

## 2024-02-08 DIAGNOSIS — M9913 Subluxation complex (vertebral) of lumbar region: Secondary | ICD-10-CM | POA: Diagnosis not present

## 2024-02-11 DIAGNOSIS — M9911 Subluxation complex (vertebral) of cervical region: Secondary | ICD-10-CM | POA: Diagnosis not present

## 2024-02-11 DIAGNOSIS — M51361 Other intervertebral disc degeneration, lumbar region with lower extremity pain only: Secondary | ICD-10-CM | POA: Diagnosis not present

## 2024-02-11 DIAGNOSIS — M9913 Subluxation complex (vertebral) of lumbar region: Secondary | ICD-10-CM | POA: Diagnosis not present

## 2024-02-15 DIAGNOSIS — M9911 Subluxation complex (vertebral) of cervical region: Secondary | ICD-10-CM | POA: Diagnosis not present

## 2024-02-15 DIAGNOSIS — M9913 Subluxation complex (vertebral) of lumbar region: Secondary | ICD-10-CM | POA: Diagnosis not present

## 2024-02-15 DIAGNOSIS — M51361 Other intervertebral disc degeneration, lumbar region with lower extremity pain only: Secondary | ICD-10-CM | POA: Diagnosis not present

## 2024-02-29 DIAGNOSIS — M9911 Subluxation complex (vertebral) of cervical region: Secondary | ICD-10-CM | POA: Diagnosis not present

## 2024-02-29 DIAGNOSIS — M51361 Other intervertebral disc degeneration, lumbar region with lower extremity pain only: Secondary | ICD-10-CM | POA: Diagnosis not present

## 2024-02-29 DIAGNOSIS — M9913 Subluxation complex (vertebral) of lumbar region: Secondary | ICD-10-CM | POA: Diagnosis not present

## 2024-03-08 DIAGNOSIS — M51361 Other intervertebral disc degeneration, lumbar region with lower extremity pain only: Secondary | ICD-10-CM | POA: Diagnosis not present

## 2024-03-08 DIAGNOSIS — M9911 Subluxation complex (vertebral) of cervical region: Secondary | ICD-10-CM | POA: Diagnosis not present

## 2024-03-08 DIAGNOSIS — M9913 Subluxation complex (vertebral) of lumbar region: Secondary | ICD-10-CM | POA: Diagnosis not present

## 2024-03-25 DIAGNOSIS — M79671 Pain in right foot: Secondary | ICD-10-CM | POA: Diagnosis not present

## 2024-04-13 DIAGNOSIS — M7661 Achilles tendinitis, right leg: Secondary | ICD-10-CM | POA: Diagnosis not present

## 2024-04-13 DIAGNOSIS — M79671 Pain in right foot: Secondary | ICD-10-CM | POA: Diagnosis not present

## 2024-04-18 DIAGNOSIS — R7303 Prediabetes: Secondary | ICD-10-CM | POA: Diagnosis not present

## 2024-04-18 DIAGNOSIS — K219 Gastro-esophageal reflux disease without esophagitis: Secondary | ICD-10-CM | POA: Diagnosis not present

## 2024-04-18 DIAGNOSIS — Z79899 Other long term (current) drug therapy: Secondary | ICD-10-CM | POA: Diagnosis not present

## 2024-04-18 DIAGNOSIS — E039 Hypothyroidism, unspecified: Secondary | ICD-10-CM | POA: Diagnosis not present

## 2024-04-18 DIAGNOSIS — R5383 Other fatigue: Secondary | ICD-10-CM | POA: Diagnosis not present

## 2024-04-18 DIAGNOSIS — R5381 Other malaise: Secondary | ICD-10-CM | POA: Diagnosis not present

## 2024-04-18 DIAGNOSIS — F411 Generalized anxiety disorder: Secondary | ICD-10-CM | POA: Diagnosis not present

## 2024-04-18 DIAGNOSIS — Z800 Family history of malignant neoplasm of digestive organs: Secondary | ICD-10-CM | POA: Diagnosis not present

## 2024-04-18 DIAGNOSIS — E782 Mixed hyperlipidemia: Secondary | ICD-10-CM | POA: Diagnosis not present

## 2024-04-18 DIAGNOSIS — M8580 Other specified disorders of bone density and structure, unspecified site: Secondary | ICD-10-CM | POA: Diagnosis not present

## 2024-04-21 ENCOUNTER — Encounter: Payer: Self-pay | Admitting: Internal Medicine

## 2024-04-21 ENCOUNTER — Telehealth: Payer: Self-pay | Admitting: Internal Medicine

## 2024-04-21 NOTE — Telephone Encounter (Signed)
 With family hx of colon cancer in 1st degree relative -- screening colonoscopy is appropriate for her now. This can be scheduled via previsit JMP

## 2024-04-21 NOTE — Telephone Encounter (Signed)
 Good afternoon Dr. Albertus,   I received a call from this patient referring provider in regards to schedule this patient for a colonoscopy. I called the patient and she stated that her brother was recently diagnosed with colon cancer last year and she does not know if she is needing to have a sooner colonoscopy. Patient is not having any symptoms. Would you please advise on how to schedule this patient.    Thank you.

## 2024-06-17 ENCOUNTER — Ambulatory Visit

## 2024-06-17 VITALS — Ht 67.0 in | Wt 192.0 lb

## 2024-06-17 DIAGNOSIS — Z800 Family history of malignant neoplasm of digestive organs: Secondary | ICD-10-CM

## 2024-06-17 DIAGNOSIS — Z860100 Personal history of colon polyps, unspecified: Secondary | ICD-10-CM

## 2024-06-17 MED ORDER — NA SULFATE-K SULFATE-MG SULF 17.5-3.13-1.6 GM/177ML PO SOLN: 1.0000 | Freq: Once | ORAL | 0 refills | Status: AC

## 2024-06-17 NOTE — Progress Notes (Signed)
 No egg or soy allergy known to patient  No issues known to pt with past sedation with any surgeries or procedures Patient denies ever being told they had issues or difficulty with intubation  No FH of Malignant Hyperthermia Pt is on diet pills, takes Phentermine  and was instructed to stop at least 7 days before procedure Pt is not on  home 02  Pt is not on blood thinners  Pt denies issues with constipation  No A fib or A flutter Have any cardiac testing pending--No Pt can ambulate  Pt denies use of chewing tobacco Discussed diabetic I weight loss medication holds Discussed NSAID holds Checked BMI Pt instructed to use [employer] or Good[employer] for a price reduction on prep  Patient's chart reviewed by Norleen Schillings CNRA prior to previsit and patient appropriate for the [facility].  Pre visit completed and red dot placed by patient's name on their procedure day (on provider's schedule).

## 2024-07-01 ENCOUNTER — Encounter: Admitting: Internal Medicine
